# Patient Record
Sex: Female | Born: 1970 | Race: Black or African American | Hispanic: No | Smoking: Former smoker
Health system: Southern US, Community
[De-identification: ages and names within clinical notes are randomized; demographics above are authoritative.]

## PROBLEM LIST (undated history)

## (undated) DIAGNOSIS — J45909 Unspecified asthma, uncomplicated: Secondary | ICD-10-CM

## (undated) DIAGNOSIS — A6000 Herpesviral infection of urogenital system, unspecified: Secondary | ICD-10-CM

## (undated) DIAGNOSIS — G35 Multiple sclerosis: Principal | ICD-10-CM

## (undated) DIAGNOSIS — E785 Hyperlipidemia, unspecified: Secondary | ICD-10-CM

## (undated) HISTORY — DX: Unspecified asthma, uncomplicated: J45.909

## (undated) HISTORY — PX: BREAST FIBROADENOMA SURGERY: SHX580

## (undated) HISTORY — DX: Multiple sclerosis: G35

## (undated) HISTORY — PX: ABDOMINAL HYSTERECTOMY: SHX81

## (undated) HISTORY — DX: Hyperlipidemia, unspecified: E78.5

## (undated) HISTORY — DX: Herpesviral infection of urogenital system, unspecified: A60.00

---

## 1999-01-14 ENCOUNTER — Emergency Department (HOSPITAL_COMMUNITY): Admission: EM | Admit: 1999-01-14 | Discharge: 1999-01-14 | Payer: Self-pay | Admitting: *Deleted

## 2001-12-19 ENCOUNTER — Other Ambulatory Visit: Admission: RE | Admit: 2001-12-19 | Discharge: 2001-12-19 | Payer: Self-pay | Admitting: Obstetrics and Gynecology

## 2002-12-24 ENCOUNTER — Other Ambulatory Visit: Admission: RE | Admit: 2002-12-24 | Discharge: 2002-12-24 | Payer: Self-pay | Admitting: Obstetrics and Gynecology

## 2003-02-28 ENCOUNTER — Other Ambulatory Visit: Admission: RE | Admit: 2003-02-28 | Discharge: 2003-02-28 | Payer: Self-pay | Admitting: Obstetrics and Gynecology

## 2004-01-23 ENCOUNTER — Other Ambulatory Visit: Admission: RE | Admit: 2004-01-23 | Discharge: 2004-01-23 | Payer: Self-pay | Admitting: Obstetrics and Gynecology

## 2005-02-21 ENCOUNTER — Other Ambulatory Visit: Admission: RE | Admit: 2005-02-21 | Discharge: 2005-02-21 | Payer: Self-pay | Admitting: Obstetrics and Gynecology

## 2005-07-27 ENCOUNTER — Encounter: Admission: RE | Admit: 2005-07-27 | Discharge: 2005-07-27 | Payer: Self-pay | Admitting: Obstetrics and Gynecology

## 2006-04-03 ENCOUNTER — Other Ambulatory Visit: Admission: RE | Admit: 2006-04-03 | Discharge: 2006-04-03 | Payer: Self-pay | Admitting: Obstetrics & Gynecology

## 2006-04-03 ENCOUNTER — Other Ambulatory Visit: Admission: RE | Admit: 2006-04-03 | Discharge: 2006-04-03 | Payer: Self-pay | Admitting: Obstetrics and Gynecology

## 2006-05-02 ENCOUNTER — Other Ambulatory Visit: Admission: RE | Admit: 2006-05-02 | Discharge: 2006-05-02 | Payer: Self-pay | Admitting: Obstetrics and Gynecology

## 2012-10-04 ENCOUNTER — Other Ambulatory Visit: Payer: Self-pay

## 2012-10-04 MED ORDER — INTERFERON BETA-1A 44 MCG/0.5ML ~~LOC~~ SOLN: 44.0000 ug | SUBCUTANEOUS | Status: DC

## 2012-10-04 NOTE — Telephone Encounter (Signed)
Patient has appt scheduled in January

## 2013-03-17 ENCOUNTER — Other Ambulatory Visit: Payer: Self-pay

## 2013-03-17 MED ORDER — INTERFERON BETA-1A 44 MCG/0.5ML ~~LOC~~ SOLN: 44.0000 ug | SUBCUTANEOUS | Status: DC

## 2013-04-22 ENCOUNTER — Encounter (INDEPENDENT_AMBULATORY_CARE_PROVIDER_SITE_OTHER): Payer: Self-pay

## 2013-04-22 ENCOUNTER — Encounter: Payer: Self-pay | Admitting: Neurology

## 2013-04-22 ENCOUNTER — Ambulatory Visit (INDEPENDENT_AMBULATORY_CARE_PROVIDER_SITE_OTHER): Payer: BC Managed Care – PPO | Admitting: Neurology

## 2013-04-22 VITALS — BP 120/78 | HR 77 | Ht 63.75 in | Wt 160.0 lb

## 2013-04-22 DIAGNOSIS — G35 Multiple sclerosis: Secondary | ICD-10-CM

## 2013-04-22 HISTORY — DX: Multiple sclerosis: G35

## 2013-04-22 MED ORDER — INTERFERON BETA-1A 44 MCG/0.5ML ~~LOC~~ SOLN: 44.0000 ug | SUBCUTANEOUS | Status: DC

## 2013-04-22 NOTE — Progress Notes (Signed)
Reason for visit: Multiple sclerosis  Sheri Dickerson is an 43 y.o. female  History of present illness:  Sheri Dickerson is a 43 year old right-handed black female with a history of multiple sclerosis. The patient has been on Rebif, last seen through this office in December of 2012. The patient has been tolerating the Rebif quite well, occasionally she will have some red areas of the skin following injection. The patient recently had several sebaceous cysts removed from the left axillary area, one of which had MRSA. The patient denies any other new medical issues that have come up since last seen. The patient indicates that she has not had any new symptoms of numbness, weakness, balance problems, or bowel or bladder control issues. The patient has developed some blurred vision problems, currently requiring reading glasses. The patient does have some minor problems with urinary incontinence, and she wears adult diapers during the day. The patient returns to this office for further evaluation. The patient indicates that she recently had blood work done through her gynecologist, and a CBC and comprehensive metabolic profile was done. The patient does not have the results of this evaluation. The patient returns to this office for further evaluation.  Past Medical History  Diagnosis Date  . Multiple sclerosis 04/22/2013  . Genital herpes   . Dyslipidemia   . Asthma     Past Surgical History  Procedure Laterality Date  . Breast fibroadenoma surgery    . Abdominal hysterectomy      Family History  Problem Relation Age of Onset  . Hypertension Mother   . Asthma Maternal Grandmother   . Cancer Maternal Grandfather     throat cancer    Social history:  reports that she has quit smoking. She does not have any smokeless tobacco history on file. She reports that she drinks alcohol. She reports that she does not use illicit drugs.    Allergies  Allergen Reactions  . Codeine     Medications:    No current outpatient prescriptions on file prior to visit.   No current facility-administered medications on file prior to visit.    ROS:  Out of a complete 14 system review of symptoms, the patient complains only of the following symptoms, and all other reviewed systems are negative.  Fatigue Neck stiffness Insomnia  Blood pressure 120/78, pulse 77, height 5' 3.75" (1.619 m), weight 160 lb (72.576 kg).  Physical Exam  General: The patient is alert and cooperative at the time of the examination.  Skin: No significant peripheral edema is noted.   Neurologic Exam  Mental status: The patient is oriented x 3.  Cranial nerves: Facial symmetry is present. Speech is normal, no aphasia or dysarthria is noted. Extraocular movements are full. Visual fields are full. Pupils are equal, round, and reactive to light. Discs are flat bilaterally.  Motor: The patient has good strength in all 4 extremities.  Sensory examination: Soft touch and pinprick sensation were symmetric on the face, arms, and legs.  Coordination: The patient has good finger-nose-finger and heel-to-shin bilaterally.  Gait and station: The patient has a normal gait. Tandem gait is normal. Romberg is negative. No drift is seen.  Reflexes: Deep tendon reflexes are symmetric.   Assessment/Plan:  One. Multiple sclerosis  The patient is doing quite well at this time on Rebif. The patient will continue the medication for now. She will try to get the blood work results faxed to our office. The patient will followup in one year. A  prescription was given for the Rebif.  Marlan Palau. Keith Shyla Gayheart MD 04/22/2013 9:20 AM  [facility] [address] [place], [place] [id number]  Phone [phone] Fax [phone]

## 2013-04-22 NOTE — Patient Instructions (Signed)
Multiple Sclerosis  Multiple sclerosis (MS) is a disease of the central nervous system. It leads to loss of the insulating covering of the nerves (myelin sheath) of your brain. When this happens, brain signals do not get transmitted properly or may not get transmitted at all. The symptoms of MS occur in episodes or attacks. These attacks may last weeks to months. There may be long periods of nearly no problems between attacks. The age of onset of MS varies.   CAUSES  The cause of MS is unknown. However, it is more common in the [place] than in the southern [place].  RISK FACTORS  There is a higher incidence of MS in women than in men. MS is not an inherited illness, although your risk of MS is higher if you have a relative with MS.  SIGNS AND SYMPTOMS   The symptoms of MS occur in episodes or attacks. These attacks may last weeks to months. There may be long periods of almost no symptoms between attacks.  The symptoms of MS vary. This is because of the many different ways it affects the central nervous system. The main symptoms of MS include:   Vision problems and eye pain.   Numbness.   Weakness.   Paralysis in your arms, hands, feet, and legs (extremities).   Balance problems.   Tremors.  DIAGNOSIS   Your health care provider can diagnose MS with the help of imaging exams and lab tests. These may include specialized X-ray exams and spinal fluid tests. The best imaging exam to confirm a diagnosis of MS is MRI.  TREATMENT   There is no known cure for MS, but there are medicines that can decrease the number and frequency of attacks. Steroids are often used for short-term relief. Physical and occupational therapy may also help.  HOME CARE INSTRUCTIONS    Take medicines as directed by your health care provider.   Exercise as directed by your health care provider.  SEEK MEDICAL CARE IF:  You begin to feel depressed.  SEEK IMMEDIATE MEDICAL CARE IF:   You develop paralysis.   You develop  problems with bladder, bowel, or sexual function.   You develop mental changes, such as forgetfulness or mood swings.   You have a seizure.  Document Released: 03/11/2000 Document Revised: 01/02/2013 Document Reviewed: 11/19/2012  Exit[employer] Patient Information 2014 [employer].

## 2014-02-12 ENCOUNTER — Encounter: Payer: Self-pay | Admitting: Neurology

## 2014-04-21 ENCOUNTER — Encounter: Payer: Self-pay | Admitting: Adult Health

## 2014-04-21 ENCOUNTER — Ambulatory Visit: Payer: Self-pay | Admitting: Nurse Practitioner

## 2014-04-21 ENCOUNTER — Ambulatory Visit (INDEPENDENT_AMBULATORY_CARE_PROVIDER_SITE_OTHER): Payer: BC Managed Care – PPO | Admitting: Adult Health

## 2014-04-21 VITALS — BP 141/92 | HR 80 | Ht 63.0 in | Wt 165.0 lb

## 2014-04-21 DIAGNOSIS — G35 Multiple sclerosis: Secondary | ICD-10-CM

## 2014-04-21 DIAGNOSIS — Z5181 Encounter for therapeutic drug level monitoring: Secondary | ICD-10-CM

## 2014-04-21 DIAGNOSIS — R350 Frequency of micturition: Secondary | ICD-10-CM

## 2014-04-21 NOTE — Patient Instructions (Addendum)
Continue Rebif.  Recheck MRI brain.  We will check blood work today as well as a urinalysis.  If your symptoms worsen or you develop new symptoms please let us know.

## 2014-04-21 NOTE — Progress Notes (Signed)
I have read the note, and I agree with the clinical assessment and plan.  Nautia Lem KEITH   

## 2014-04-21 NOTE — Progress Notes (Signed)
PATIENT: Sheri Dickerson DOB: 09-05-1970  REASON FOR VISIT: follow up- multiple sclerosis HISTORY FROM: patient  HISTORY OF PRESENT ILLNESS: Sheri Dickerson is a 44 year old female with a hstiory of multiple sclerosis. She returns today for follow-up. She is currently taking Rebif and tolerating it well. She denies any new weakness or numbness. She states that in the morning when she wakes up  she has trouble opening up bottles. She states that her hands feel numb and weak. They will occasionally lock up in one position. As the day progresses this improves. She states this has been going on for several months. She will also have tingling in the feet either when sitting for a while or standing and again this has been going on for several months. The tingling is not constant.  No changes in her gait or balance. She ambulates without an assistive device. She states that she does have constipation and will use magnesium sulfate. She also has urinary frequency. She has been wearing the pose depends in case she has accidents. She states that she often has to hold her urine while at work. the bathroom at work is located in the other building where she works. She states that she often feels the need to go urinate every 15-30 minutes however she tends to hold it till its a good time a work.  No changes in her vision. Her last MRI of the brain according to the patient was >1-2 years ago.  The patient does state that she was on prednisone that she finished last week due to a rash on her head. While taking the prednisone she did not notice any benefit in her hands or feet.  HISTORY 04/22/13 (WILLIS): Sheri Dickerson is a 44 year old right-handed black female with a history of multiple sclerosis. The patient has been on Rebif, last seen through this office in December of 2012. The patient has been tolerating the Rebif quite well, occasionally she will have some red areas of the skin following injection. The patient recently  had several sebaceous cysts removed from the left axillary area, one of which had MRSA. The patient denies any other new medical issues that have come up since last seen. The patient indicates that she has not had any new symptoms of numbness, weakness, balance problems, or bowel or bladder control issues. The patient has developed some blurred vision problems, currently requiring reading glasses. The patient does have some minor problems with urinary incontinence, and she wears adult diapers during the day. The patient returns to this office for further evaluation. The patient indicates that she recently had blood work done through her gynecologist, and a CBC and comprehensive metabolic profile was done. The patient does not have the results of this evaluation. The patient returns to this office for further evaluation.     REVIEW OF SYSTEMS: Out of a complete 14 system review of symptoms, the patient complains only of the following symptoms, and all other reviewed systems are negative.   chills, runny nose, eye itching comment, blurred vision, cough, constipation, insomnia, frequent infections , frequency of urination, urgency, joint pain, walking difficulty , moles, itching, numbness, weakness  ALLERGIES: Allergies  Allergen Reactions  . Codeine     HOME MEDICATIONS: Outpatient Prescriptions Prior to Visit  Medication Sig Dispense Refill  . acetaminophen (TYLENOL) 325 MG tablet Take 650 mg by mouth as needed.    . Calcium-Phosphorus-Vitamin D (CALCIUM GUMMIES PO) Take by mouth.    . cetirizine (ZYRTEC)  10 MG tablet Take 10 mg by mouth daily.    . Cholecalciferol (VITAMIN D3 ADULT GUMMIES PO) Take by mouth.    . interferon beta-1a (REBIF) 44 MCG/0.5ML injection Inject 0.5 mLs (44 mcg total) into the skin 3 (three) times a week. 18 mL 3  . magnesium gluconate (MAGONATE) 500 MG tablet Take 500 mg by mouth 2 (two) times daily.    . Multiple Vitamins-Minerals (MULTIVITAMIN GUMMIES ADULT PO) Take  by mouth.    . Probiotic Product (PROBIOTIC DAILY PO) Take by mouth.    . valACYclovir (VALTREX) 500 MG tablet Take 500 mg by mouth daily.     . mometasone-formoterol (DULERA) 100-5 MCG/ACT AERO Inhale 2 puffs into the lungs 2 (two) times daily as needed for wheezing.     No facility-administered medications prior to visit.    PAST MEDICAL HISTORY: Past Medical History  Diagnosis Date  . Multiple sclerosis 04/22/2013  . Genital herpes   . Dyslipidemia   . Asthma     PAST SURGICAL HISTORY: Past Surgical History  Procedure Laterality Date  . Breast fibroadenoma surgery    . Abdominal hysterectomy      FAMILY HISTORY: Family History  Problem Relation Age of Onset  . Hypertension Mother   . Asthma Maternal Grandmother   . Cancer Maternal Grandfather     throat cancer    SOCIAL HISTORY: History   Social History  . Marital Status: Single    Spouse Name: N/A    Number of Children: 0  . Years of Education: college   Occupational History  . [employer]    Social History Main Topics  . Smoking status: Former Games developer  . Smokeless tobacco: Not on file  . Alcohol Use: Yes     Comment: occasional  . Drug Use: No  . Sexual Activity: Not on file   Other Topics Concern  . Not on file   Social History Narrative      PHYSICAL EXAM  Filed Vitals:   04/21/14 1532  BP: 141/92  Pulse: 80  Height: 5\' 3"  (1.6 m)  Weight: 165 lb (74.844 kg)   Body mass index is 29.24 kg/(m^2).  Generalized: Well developed, in no acute distress   Neurological examination  Mentation: Alert oriented to time, place, history taking. Follows all commands speech and language fluent Cranial nerve II-XII: Pupils were equal round reactive to light. Extraocular movements were full, visual field were full on confrontational test. Facial sensation and strength were normal. Uvula tongue midline. Head turning and shoulder shrug  were normal and symmetric. Motor: The motor testing reveals 5  over 5 strength of all 4 extremities. Good symmetric motor tone is noted throughout.  Tinel sign negative Sensory: Sensory testing is intact to soft touch on all 4 extremities. No evidence of extinction is noted.  Coordination: Cerebellar testing reveals good finger-nose-finger and heel-to-shin bilaterally.  Gait and station: Gait is normal. Tandem gait is normal. Romberg is negative. No drift is seen.  Reflexes: Deep tendon reflexes are symmetric and normal bilaterally.  Marland Kitchen   DIAGNOSTIC DATA (LABS, IMAGING, TESTING) - I reviewed patient records, labs, notes, testing and imaging myself where available.   MRI of the brain W/WO contrast 03/30/2011:  impression:  Multiple supratentorial and one infratentorial chronic demyelinating plaques. No acute plaques are seen.    ASSESSMENT AND PLAN 44 y.o. year old female  has a past medical history of Multiple sclerosis (04/22/2013); Genital herpes; Dyslipidemia; and Asthma. here with:  1. Multiple sclerosis  2. Urinary frequency   The patient is reporting some numbness and weakness in the hands noted primarily in the mornings as well as tingling in the feet that occurs intermittently with standing or sitting.  Tinel sign was negative. The patient's last MRI was in 2013. I will recheck an MRI of the brain to look for progression. The patient is also reporting urinary frequency. Saying that she feels the urge to go every 15-30 minutes. I will check a urinalysis to ensure that she does not have an infection. If not we may consider starting oxybutynin. The patient should continue taking rebif.  I will check blood work today. If her symptoms worsen or she develops new symptoms she should let us know. Otherwise she will follow up in 6 months or sooner if needed.    Butch Penny, MSN, NP-C 04/21/2014, 3:59 PM [facility] [address], [address] [place], [place] [address] [phone]  Note: This document was prepared with digital  dictation and possible smart phrase technology. Any transcriptional errors that result from this process are unintentional.

## 2014-04-22 ENCOUNTER — Telehealth: Payer: Self-pay | Admitting: Adult Health

## 2014-04-22 ENCOUNTER — Ambulatory Visit: Payer: Self-pay | Admitting: Adult Health

## 2014-04-22 LAB — COMPREHENSIVE METABOLIC PANEL
ALBUMIN: 4.4 g/dL (ref 3.5–5.5)
ALK PHOS: 98 IU/L (ref 39–117)
ALT: 20 IU/L (ref 0–32)
AST: 20 IU/L (ref 0–40)
Albumin/Globulin Ratio: 1.5 (ref 1.1–2.5)
BUN/Creatinine Ratio: 18 (ref 9–23)
BUN: 14 mg/dL (ref 6–24)
CO2: 27 mmol/L (ref 18–29)
Calcium: 10.2 mg/dL (ref 8.7–10.2)
Chloride: 102 mmol/L (ref 97–108)
Creatinine, Ser: 0.78 mg/dL (ref 0.57–1.00)
GFR calc Af Amer: 107 mL/min/{1.73_m2} (ref >59)
GFR calc non Af Amer: 93 mL/min/{1.73_m2} (ref >59)
GLUCOSE: 104 mg/dL — AB (ref 65–99)
Globulin, Total: 2.9 g/dL (ref 1.5–4.5)
Potassium: 5.4 mmol/L — ABNORMAL HIGH (ref 3.5–5.2)
Sodium: 144 mmol/L (ref 134–144)
TOTAL PROTEIN: 7.3 g/dL (ref 6.0–8.5)

## 2014-04-22 LAB — URINALYSIS
Bilirubin, UA: NEGATIVE
Glucose, UA: NEGATIVE
KETONES UA: NEGATIVE
Leukocytes, UA: NEGATIVE
NITRITE UA: NEGATIVE
PH UA: 5.5 (ref 5.0–7.5)
PROTEIN UA: NEGATIVE
RBC UA: NEGATIVE
Specific Gravity, UA: 1.027 (ref 1.005–1.030)
Urobilinogen, Ur: 0.2 mg/dL (ref 0.2–1.0)

## 2014-04-22 LAB — CBC WITH DIFFERENTIAL/PLATELET
Basophils Absolute: 0 10*3/uL (ref 0.0–0.2)
Basos: 0 %
EOS ABS: 0.2 10*3/uL (ref 0.0–0.4)
Eos: 2 %
HEMATOCRIT: 37.8 % (ref 34.0–46.6)
HEMOGLOBIN: 12.8 g/dL (ref 11.1–15.9)
IMMATURE GRANS (ABS): 0 10*3/uL (ref 0.0–0.1)
IMMATURE GRANULOCYTES: 0 %
Lymphocytes Absolute: 3.6 10*3/uL — ABNORMAL HIGH (ref 0.7–3.1)
Lymphs: 40 %
MCH: 30.7 pg (ref 26.6–33.0)
MCHC: 33.9 g/dL (ref 31.5–35.7)
MCV: 91 fL (ref 79–97)
MONOS ABS: 0.6 10*3/uL (ref 0.1–0.9)
Monocytes: 7 %
Neutrophils Absolute: 4.6 10*3/uL (ref 1.4–7.0)
Neutrophils Relative %: 51 %
PLATELETS: 295 10*3/uL (ref 150–379)
RBC: 4.17 x10E6/uL (ref 3.77–5.28)
RDW: 14.2 % (ref 12.3–15.4)
WBC: 9 10*3/uL (ref 3.4–10.8)

## 2014-04-22 MED ORDER — OXYBUTYNIN CHLORIDE ER 5 MG PO TB24: 5.0000 mg | ORAL_TABLET | Freq: Every day | ORAL | Status: DC

## 2014-04-22 NOTE — Telephone Encounter (Signed)
I called the patient. Her lab work was unremarkable. Her urinalysis did not indicate any type of infection. I will start the patient on Ditropan XL 5 mg daily at bedtime. I have reviewed the side effects with the patient. She verbalized understanding. If she is unable to tolerate this medication she should let us know.

## 2014-05-21 ENCOUNTER — Other Ambulatory Visit: Payer: Self-pay | Admitting: Adult Health

## 2014-05-22 ENCOUNTER — Ambulatory Visit (INDEPENDENT_AMBULATORY_CARE_PROVIDER_SITE_OTHER): Payer: BC Managed Care – PPO

## 2014-05-22 DIAGNOSIS — G35 Multiple sclerosis: Secondary | ICD-10-CM | POA: Diagnosis not present

## 2014-05-22 MED ORDER — GADOPENTETATE DIMEGLUMINE 469.01 MG/ML IV SOLN: 15.0000 mL | Freq: Once | INTRAVENOUS | Status: AC | PRN

## 2014-05-26 ENCOUNTER — Telehealth: Payer: Self-pay | Admitting: Adult Health

## 2014-05-26 NOTE — Telephone Encounter (Signed)
I called the patient. We have the patient's MRI of the brain results however it has not been compared to her previous scan. I have requested the CD from her previous scan. Once I have this I will ask Dr. Pearlean Brownie to compare the two scans. Patient was called and made aware.

## 2014-05-29 NOTE — Progress Notes (Signed)
Quick Note:  Called and spoke with patient and relayed MRI was compared no change Current MRI stable. Patient understood. ______

## 2014-07-14 ENCOUNTER — Telehealth: Payer: Self-pay | Admitting: Neurology

## 2014-07-14 MED ORDER — INTERFERON BETA-1A 44 MCG/0.5ML ~~LOC~~ SOSY: 44.0000 ug | PREFILLED_SYRINGE | SUBCUTANEOUS | Status: DC

## 2014-07-14 NOTE — Telephone Encounter (Signed)
Patient requesting 90 day supply refill for Rx interferon beta-1a (REBIF) 44 MCG/0.5ML injection.  Please forward to [facility].  Please call and advise.

## 2014-07-14 NOTE — Telephone Encounter (Signed)
Rx has been sent.  I called back to advise x 3.  Each time it sounded like someone picked up, but never said anything and call disconnected.

## 2014-10-31 ENCOUNTER — Ambulatory Visit: Payer: BC Managed Care – PPO | Admitting: Adult Health

## 2015-02-03 ENCOUNTER — Other Ambulatory Visit: Payer: Self-pay

## 2015-02-03 MED ORDER — INTERFERON BETA-1A 44 MCG/0.5ML ~~LOC~~ SOSY: 44.0000 ug | PREFILLED_SYRINGE | SUBCUTANEOUS | Status: DC

## 2015-06-08 ENCOUNTER — Telehealth: Payer: Self-pay | Admitting: Neurology

## 2015-06-08 MED ORDER — INTERFERON BETA-1A 44 MCG/0.5ML ~~LOC~~ SOSY: 44.0000 ug | PREFILLED_SYRINGE | SUBCUTANEOUS | Status: DC

## 2015-06-08 NOTE — Telephone Encounter (Signed)
Pt hasn't been seen in over a year. I called and spoke with pt, got her scheduled for 07/23/15 at 8:00. Pt verbalized understanding. Refilled rebif and sent to [facility].

## 2015-06-08 NOTE — Telephone Encounter (Signed)
Pt called sts she is out of interferon beta-1a (REBIF) 44 MCG/0.5ML SOSY injection 3 mth supply. She has a new insurance card, Ins is still with [facility] plan ID# [id number]  BIN# [id number]  IEP#[id number].

## 2015-06-09 ENCOUNTER — Telehealth: Payer: Self-pay | Admitting: Neurology

## 2015-06-09 NOTE — Telephone Encounter (Signed)
[facility] called and needs to verbally verify the rx that was sent to them from [facility]. Pt has had an insurance change and will be using [facility] now. Please call [phone] ask for a pharmacist

## 2015-06-09 NOTE — Telephone Encounter (Signed)
Marissa with [facility] [phone] ext [phone] called and requesting diagnosis code for pt. I gave her the new Insurance info

## 2015-06-09 NOTE — Telephone Encounter (Signed)
Please see if you can verify Rx as requested.

## 2015-06-09 NOTE — Telephone Encounter (Signed)
Have attempted to reach Sheri Dickerson with [facility] x 3; no option to enter ext #. After automated system states "call will be transferred", the  call is disconnected.

## 2015-06-10 NOTE — Telephone Encounter (Signed)
Spoke with Ladona Ridgel, pharmacist, [facility]. She requested verification of patient's rebif prescription. Clarified all details, directions, instructions of prescription. Ladona Ridgel verbalized understanding, appreciation for call back.

## 2015-07-01 ENCOUNTER — Telehealth: Payer: Self-pay | Admitting: Adult Health

## 2015-07-01 NOTE — Telephone Encounter (Signed)
We have never received CD of her previous MRI brain. Can you get these?

## 2015-07-23 ENCOUNTER — Ambulatory Visit (INDEPENDENT_AMBULATORY_CARE_PROVIDER_SITE_OTHER): Payer: BC Managed Care – PPO | Admitting: Adult Health

## 2015-07-23 ENCOUNTER — Ambulatory Visit: Payer: Self-pay | Admitting: Neurology

## 2015-07-23 ENCOUNTER — Encounter: Payer: Self-pay | Admitting: Adult Health

## 2015-07-23 VITALS — BP 150/95 | HR 81 | Ht 63.0 in | Wt 150.2 lb

## 2015-07-23 DIAGNOSIS — G35 Multiple sclerosis: Secondary | ICD-10-CM

## 2015-07-23 MED ORDER — OXYBUTYNIN CHLORIDE ER 5 MG PO TB24: 5.0000 mg | ORAL_TABLET | Freq: Every day | ORAL | Status: DC

## 2015-07-23 NOTE — Progress Notes (Signed)
PATIENT: Sheri Dickerson DOB: 1971-01-08  REASON FOR VISIT: follow up- multiple sclerosis HISTORY FROM: patient  HISTORY OF PRESENT ILLNESS: Sheri Dickerson is a 45 year old female with a history of multiple sclerosis. She returns today for follow-up. She is currently taking Rebif and tolerating it well. She does state that the injection sites are sometimes red and sore. She has been trying to use a warm compress prior to the injections. She states that at times she does have blurry vision. She also feels that  her balance is off at times. Denies any falls. She does report cramps in the legs. She reports that she is drinking water throughout the day. She has continued having trouble opening bottles and jars. She is using oxybutynin for urinary urgency.  She denies any new numbness or weakness. Her last MRI was February 2016. She reports that she recently had blood work through her OB/GYN. She returns today for evaluation.  HISTORY 04/21/14 (MM): Sheri Dickerson is a 45 year old female with a hstiory of multiple sclerosis. She returns today for follow-up. She is currently taking Rebif and tolerating it well. She denies any new weakness or numbness. She states that in the morning when she wakes up she has trouble opening up bottles. She states that her hands feel numb and weak. They will occasionally lock up in one position. As the day progresses this improves. She states this has been going on for several months. She will also have tingling in the feet either when sitting for a while or standing and again this has been going on for several months. The tingling is not constant. No changes in her gait or balance. She ambulates without an assistive device. She states that she does have constipation and will use magnesium sulfate. She also has urinary frequency. She has been wearing the pose depends in case she has accidents. She states that she often has to hold her urine while at work. the bathroom at work is  located in the other building where she works. She states that she often feels the need to go urinate every 15-30 minutes however she tends to hold it till its a good time a work. No changes in her vision. Her last MRI of the brain according to the patient was >1-2 years ago. The patient does state that she was on prednisone that she finished last week due to a rash on her head. While taking the prednisone she did not notice any benefit in her hands or feet.  HISTORY 04/22/13 (WILLIS): Sheri Dickerson is a 45 year old right-handed black female with a history of multiple sclerosis. The patient has been on Rebif, last seen through this office in December of 2012. The patient has been tolerating the Rebif quite well, occasionally she will have some red areas of the skin following injection. The patient recently had several sebaceous cysts removed from the left axillary area, one of which had MRSA. The patient denies any other new medical issues that have come up since last seen. The patient indicates that she has not had any new symptoms of numbness, weakness, balance problems, or bowel or bladder control issues. The patient has developed some blurred vision problems, currently requiring reading glasses. The patient does have some minor problems with urinary incontinence, and she wears adult diapers during the day. The patient returns to this office for further evaluation. The patient indicates that she recently had blood work done through her gynecologist, and a CBC and comprehensive metabolic profile  was done. The patient does not have the results of this evaluation. The patient returns to this office for further evaluation  REVIEW OF SYSTEMS: Out of a complete 14 system review of symptoms, the patient complains only of the following symptoms, and all other reviewed systems are negative.  ALLERGIES: Allergies  Allergen Reactions  . Codeine     HOME MEDICATIONS: Outpatient Prescriptions Prior to Visit    Medication Sig Dispense Refill  . acetaminophen (TYLENOL) 325 MG tablet Take 650 mg by mouth as needed.    . Calcium-Phosphorus-Vitamin D (CALCIUM GUMMIES PO) Take by mouth.    . cetirizine (ZYRTEC) 10 MG tablet Take 10 mg by mouth daily.    . Cholecalciferol (VITAMIN D3 ADULT GUMMIES PO) Take by mouth.    . interferon beta-1a (REBIF) 44 MCG/0.5ML SOSY injection Inject 0.5 mLs (44 mcg total) into the skin 3 (three) times a week. 36 Syringe 0  . magnesium gluconate (MAGONATE) 500 MG tablet Take 500 mg by mouth 2 (two) times daily.    . mometasone-formoterol (DULERA) 100-5 MCG/ACT AERO Inhale 2 puffs into the lungs 2 (two) times daily as needed for wheezing.    . Multiple Vitamins-Minerals (MULTIVITAMIN GUMMIES ADULT PO) Take by mouth.    . oxybutynin (DITROPAN-XL) 5 MG 24 hr tablet TAKE 1 TABLET (5 MG TOTAL) BY MOUTH AT BEDTIME. 30 tablet 0  . Probiotic Product (PROBIOTIC DAILY PO) Take by mouth.    . valACYclovir (VALTREX) 500 MG tablet Take 500 mg by mouth daily.      No facility-administered medications prior to visit.    PAST MEDICAL HISTORY: Past Medical History  Diagnosis Date  . Multiple sclerosis (HCC) 04/22/2013  . Genital herpes   . Dyslipidemia   . Asthma     PAST SURGICAL HISTORY: Past Surgical History  Procedure Laterality Date  . Breast fibroadenoma surgery    . Abdominal hysterectomy      FAMILY HISTORY: Family History  Problem Relation Age of Onset  . Hypertension Mother   . Asthma Maternal Grandmother   . Cancer Maternal Grandfather     throat cancer    SOCIAL HISTORY: Social History   Social History  . Marital Status: Single    Spouse Name: N/A  . Number of Children: 0  . Years of Education: college   Occupational History  . [employer]    Social History Main Topics  . Smoking status: Former Games developer  . Smokeless tobacco: Not on file  . Alcohol Use: Yes     Comment: occasional  . Drug Use: No  . Sexual Activity: Not on file   Other  Topics Concern  . Not on file   Social History Narrative      PHYSICAL EXAM  Filed Vitals:   07/23/15 0852  BP: 150/95  Pulse: 81  Height: 5\' 3"  (1.6 m)  Weight: 150 lb 3.2 oz (68.13 kg)   Body mass index is 26.61 kg/(m^2).  Generalized: Well developed, in no acute distress   Neurological examination  Mentation: Alert oriented to time, place, history taking. Follows all commands speech and language fluent Cranial nerve II-XII: Pupils were equal round reactive to light. Extraocular movements were full, visual field were full on confrontational test. Facial sensation and strength were normal. Uvula tongue midline. Head turning and shoulder shrug  were normal and symmetric. Motor: The motor testing reveals 5 over 5 strength of all 4 extremities. Good symmetric motor tone is noted throughout.  Sensory: Sensory testing is  intact to soft touch on all 4 extremities. No evidence of extinction is noted.  Coordination: Cerebellar testing reveals good finger-nose-finger and heel-to-shin bilaterally.  Gait and station: Gait is normal. Tandem gait is normal. Romberg is negative. No drift is seen.  Reflexes: Deep tendon reflexes are symmetric and normal bilaterally.   DIAGNOSTIC DATA (LABS, IMAGING, TESTING) - I reviewed patient records, labs, notes, testing and imaging myself where available.  Lab Results  Component Value Date   WBC 9.0 04/21/2014   HGB 12.8 04/21/2014   HCT 37.8 04/21/2014   MCV 91 04/21/2014   PLT 295 04/21/2014      Component Value Date/Time   NA 144 04/21/2014 1632   K 5.4* 04/21/2014 1632   CL 102 04/21/2014 1632   CO2 27 04/21/2014 1632   GLUCOSE 104* 04/21/2014 1632   BUN 14 04/21/2014 1632   CREATININE 0.78 04/21/2014 1632   CALCIUM 10.2 04/21/2014 1632   PROT 7.3 04/21/2014 1632   ALBUMIN 4.4 04/21/2014 1632   AST 20 04/21/2014 1632   ALT 20 04/21/2014 1632   ALKPHOS 98 04/21/2014 1632   BILITOT <0.2 04/21/2014 1632   GFRNONAA 93 04/21/2014 1632     GFRAA 107 04/21/2014 1632      ASSESSMENT AND PLAN 45 y.o. year old female  has a past medical history of Multiple sclerosis (HCC) (04/22/2013); Genital herpes; Dyslipidemia; and Asthma. here with:  1. Multiple sclerosis  The patient's physical exam is unremarkable. Overall she is doing well. She will continue on Rebif. She recently had blood work with her OB/GYN. She will have the results faxed to our office. The patient had an MRI in February 2016 however it was not compared to her previous scan. I now have the CD and will have Dr. Pearlean Brownie make a comparison. I will call the patient with the results. She is amenable to this plan. She will follow-up in 6 months with Dr. Anne Hahn.     Butch Penny, MSN, NP-C 07/23/2015, 9:08 AM [facility] [address] [place], [place] [address] [phone]

## 2015-07-23 NOTE — Patient Instructions (Signed)
Continue Rebif  Continue oxybutynin  Have blood faxed to our office If your symptoms worsen or you develop new symptoms please let us know.

## 2015-07-23 NOTE — Progress Notes (Signed)
I agree with the assessment and plan as directed by NP .The patient was seen in my role as a WID   Kymere Fullington, MD  

## 2015-07-27 ENCOUNTER — Telehealth: Payer: Self-pay | Admitting: Adult Health

## 2015-07-27 NOTE — Telephone Encounter (Signed)
I called the patient and left a message. I asked that she call our office.   I had Dr. Pearlean Brownie compare her MRIs. He reports that there was some slight changes and possibly one new lesion. He recommended that she remain on rebif unless she develops new symptoms.

## 2015-07-30 ENCOUNTER — Telehealth: Payer: Self-pay | Admitting: Adult Health

## 2015-07-30 NOTE — Telephone Encounter (Signed)
I called the patient to review her MRI results. Left message for her to call our office at her convenience.  Also received the patient's blood work from her primary care. Her blood work was relatively unremarkable.  WBC 6.7 RBC 4.06 Hgb 12.5 HCT 37.8 AST 23 ALT 23

## 2015-08-27 ENCOUNTER — Other Ambulatory Visit: Payer: Self-pay | Admitting: Neurology

## 2015-09-18 ENCOUNTER — Telehealth: Payer: Self-pay | Admitting: Neurology

## 2015-09-18 MED ORDER — INTERFERON BETA-1A 44 MCG/0.5ML ~~LOC~~ SOSY: PREFILLED_SYRINGE | SUBCUTANEOUS | Status: DC

## 2015-09-18 NOTE — Telephone Encounter (Signed)
90 day refill e-scribed as requested

## 2015-09-18 NOTE — Telephone Encounter (Signed)
[facility] [phone] called to request change in REBIF 44 MCG/0.5ML SOSY injection prescription to a 90 day supply.

## 2016-01-22 ENCOUNTER — Encounter: Payer: Self-pay | Admitting: Neurology

## 2016-01-22 ENCOUNTER — Ambulatory Visit (INDEPENDENT_AMBULATORY_CARE_PROVIDER_SITE_OTHER): Payer: BC Managed Care – PPO | Admitting: Neurology

## 2016-01-22 VITALS — BP 141/94 | HR 94 | Ht 63.0 in | Wt 149.2 lb

## 2016-01-22 DIAGNOSIS — M533 Sacrococcygeal disorders, not elsewhere classified: Secondary | ICD-10-CM | POA: Insufficient documentation

## 2016-01-22 MED ORDER — METHOCARBAMOL 500 MG PO TABS: 500.0000 mg | ORAL_TABLET | Freq: Three times a day (TID) | ORAL | 0 refills | Status: DC

## 2016-01-22 MED ORDER — MELOXICAM 15 MG PO TABS: 15.0000 mg | ORAL_TABLET | Freq: Every day | ORAL | 1 refills | Status: DC

## 2016-01-22 MED ORDER — FLUOXETINE HCL 10 MG PO CAPS: 10.0000 mg | ORAL_CAPSULE | Freq: Every day | ORAL | 3 refills | Status: DC

## 2016-01-22 NOTE — Progress Notes (Signed)
Reason for visit: Multiple sclerosis  Sheri Dickerson is an 45 y.o. female  History of present illness:  Sheri Dickerson is a 45 year old right-handed black female with a history of multiple sclerosis. The patient is on Rebif, she is tolerating the medication well. She has not had any definite new symptoms associated with her multiple sclerosis. She has some difficulty with bladder control, she is on medication for this. She apparent had a fall 2 or 3 months ago when she fell out of a chair. The patient has had some discomfort at the base of the spine with sitting, she has no pain with standing or walking. The patient also has some stiffness of the neck, some discomfort into the left neck and into the right shoulder area. She has some crepitus with turning her head. She denies any pain going down arms. The pain has not completely improved over time. She returns to this office for an evaluation. She reports some anxiety with work, she wants to know if she can take a medication to help calm this down.  Past Medical History:  Diagnosis Date  . Asthma   . Dyslipidemia   . Genital herpes   . Multiple sclerosis (HCC) 04/22/2013    Past Surgical History:  Procedure Laterality Date  . ABDOMINAL HYSTERECTOMY    . BREAST FIBROADENOMA SURGERY      Family History  Problem Relation Age of Onset  . Hypertension Mother   . Asthma Maternal Grandmother   . Cancer Maternal Grandfather     throat cancer    Social history:  reports that she has quit smoking. She has never used smokeless tobacco. She reports that she drinks alcohol. She reports that she does not use drugs.    Allergies  Allergen Reactions  . Codeine     Medications:  Prior to Admission medications   Medication Sig Start Date End Date Taking? Authorizing Provider  acetaminophen (TYLENOL) 325 MG tablet Take 650 mg by mouth as needed.   Yes Historical Provider, MD  Calcium-Phosphorus-Vitamin D (CALCIUM GUMMIES PO) Take by mouth.    Yes Historical Provider, MD  cetirizine (ZYRTEC) 10 MG tablet Take 10 mg by mouth daily.   Yes Historical Provider, MD  Cholecalciferol (VITAMIN D3 ADULT GUMMIES PO) Take by mouth.   Yes Historical Provider, MD  fluticasone Aleda Grana(FLONASE) 50 MCG/ACT nasal spray  05/11/13  Yes Historical Provider, MD  interferon beta-1a (REBIF) 44 MCG/0.5ML SOSY injection INJECT 44MCG SUBCUTANEOUSLY THREE TIMES A WEEK 09/18/15  Yes York Spanielharles K Cheron Pasquarelli, MD  magnesium gluconate (MAGONATE) 500 MG tablet Take 500 mg by mouth 2 (two) times daily.   Yes Historical Provider, MD  mometasone-formoterol (DULERA) 100-5 MCG/ACT AERO Inhale 2 puffs into the lungs 2 (two) times daily as needed for wheezing.   Yes Historical Provider, MD  Multiple Vitamins-Minerals (MULTIVITAMIN GUMMIES ADULT PO) Take by mouth.   Yes Historical Provider, MD  oxybutynin (DITROPAN-XL) 5 MG 24 hr tablet Take 1 tablet (5 mg total) by mouth at bedtime. 07/23/15  Yes Butch PennyMegan Millikan, NP  Probiotic Product (PROBIOTIC DAILY PO) Take by mouth.   Yes Historical Provider, MD  valACYclovir (VALTREX) 500 MG tablet Take 500 mg by mouth daily.    Yes Historical Provider, MD    ROS:  Out of a complete 14 system review of symptoms, the patient complains only of the following symptoms, and all other reviewed systems are negative.  Chills, fatigue Eye redness Cold intolerance, excessive thirst Constipation Restless legs, frequent waking  Frequency of urination, urinary urgency Bruising easily Headache, numbness, weakness Anxiety, hyperactivity  Blood pressure (!) 141/94, pulse 94, height 5\' 3"  (1.6 m), weight 149 lb 4 oz (67.7 kg).  Physical Exam  General: The patient is alert and cooperative at the time of the examination.  Neuromuscular: Range of movement of the cervical spine is full.  Skin: No significant peripheral edema is noted.   Neurologic Exam  Mental status: The patient is alert and oriented x 3 at the time of the examination. The patient has  apparent normal recent and remote memory, with an apparently normal attention span and concentration ability.   Cranial nerves: Facial symmetry is present. Speech is normal, no aphasia or dysarthria is noted. Extraocular movements are full. Visual fields are full.  Motor: The patient has good strength in all 4 extremities.  Sensory examination: Soft touch sensation is symmetric on the face, arms, and legs.  Coordination: The patient has good finger-nose-finger and heel-to-shin bilaterally.  Gait and station: The patient has a normal gait. Tandem gait is normal. Romberg is negative. No drift is seen.  Reflexes: Deep tendon reflexes are symmetric.   MRI brain 05/22/14:  IMPRESSION:  Abnormal MRI scan of the brain showing multiple supratentorial periventricular, subcortical and corpus callosum white matter hyperintensities typical for demyelinating disease. No enhancing lesions are noted. Presence of multiple T1 black holes and mild atrophy suggest chronic disease  * MRI scan images were reviewed online. I agree with the written report.    Assessment/Plan:  1. Multiple sclerosis  2. Cervical strain  3. Coccygeal pain  The patient will be placed on Mobic and Robaxin for 6 weeks, if she does not improve with her symptoms, we may initiate physical therapy and consider x-rays of the neck. The patient will continue her Rebif, she will be placed on low-dose Prozac for her anxiety issues. She will follow-up in 6 months. She has had blood work done in the spring of 2017 through her OB/GYN doctor. MRI of the brain can be done at some point next year.  Marlan Palau MD 01/22/2016 9:23 AM  [facility] [address] [place], [place] [address]  Phone [phone] Fax [phone]

## 2016-02-15 ENCOUNTER — Other Ambulatory Visit: Payer: Self-pay

## 2016-02-15 MED ORDER — FLUOXETINE HCL 10 MG PO CAPS: 10.0000 mg | ORAL_CAPSULE | Freq: Every day | ORAL | 3 refills | Status: DC

## 2016-02-15 NOTE — Telephone Encounter (Signed)
90 day refills sent in to mail order pharmacy per faxed request. 

## 2016-06-06 ENCOUNTER — Telehealth: Payer: Self-pay | Admitting: *Deleted

## 2016-06-06 NOTE — Telephone Encounter (Signed)
Called and LVM for pt to call. Gave hours and advised we are closing early today at 12pm d/t weather  Received notification from [facility] that pt may not be filling rx fluoxetine. Wanted to verify this with pt.

## 2016-06-14 NOTE — Telephone Encounter (Signed)
Called and spoke with pt.  Relayed message below. Verified she was given fluoxetine for anxiety. She stated she is taking fluoxetine but not every day. Educated patient that this medication needs to be taken everyday, do not miss any doses. Takes at least 4-6 weeks for it to reach max benefit and for her to see if it is helping. She verbalized understanding and stated she was not aware of this. She will start to take everyday. She does not need refill. She has enough. She will call with any further questions or concerns.

## 2016-06-30 MED ORDER — ESCITALOPRAM OXALATE 10 MG PO TABS: 10.0000 mg | ORAL_TABLET | Freq: Every day | ORAL | 3 refills | Status: DC

## 2016-06-30 NOTE — Telephone Encounter (Signed)
I called patient. The patient indicates that she started taking Prozac daily and now has a skin rash with hives. She stopped the medication one week ago, but the rash is still there. It may take 3 weeks or Prozac to get out of the system. I will call and Lexapro, but the patient is not to start this medication until the skin rash is gone. If the skin rash does not abate, she will need to see dermatology.

## 2016-06-30 NOTE — Telephone Encounter (Signed)
Patient wants to discuss medication FLUoxetine (PROZAC) 10 MG capsule. She says when she started taking this medication every day she started breaking out in hives. Please call to discuss.

## 2016-06-30 NOTE — Addendum Note (Signed)
Addended by: York Spaniel on: 06/30/2016 10:35 AM   Modules accepted: Orders

## 2016-07-25 ENCOUNTER — Encounter (INDEPENDENT_AMBULATORY_CARE_PROVIDER_SITE_OTHER): Payer: Self-pay

## 2016-07-25 ENCOUNTER — Ambulatory Visit (INDEPENDENT_AMBULATORY_CARE_PROVIDER_SITE_OTHER): Payer: BC Managed Care – PPO | Admitting: Adult Health

## 2016-07-25 ENCOUNTER — Encounter: Payer: Self-pay | Admitting: Adult Health

## 2016-07-25 VITALS — BP 148/80 | HR 80 | Ht 63.0 in | Wt 148.6 lb

## 2016-07-25 DIAGNOSIS — M542 Cervicalgia: Secondary | ICD-10-CM | POA: Diagnosis not present

## 2016-07-25 DIAGNOSIS — G35 Multiple sclerosis: Secondary | ICD-10-CM | POA: Diagnosis not present

## 2016-07-25 DIAGNOSIS — Z5181 Encounter for therapeutic drug level monitoring: Secondary | ICD-10-CM

## 2016-07-25 DIAGNOSIS — F419 Anxiety disorder, unspecified: Secondary | ICD-10-CM | POA: Diagnosis not present

## 2016-07-25 NOTE — Progress Notes (Signed)
I have read the note, and I agree with the clinical assessment and plan.  Shannell Mikkelsen KEITH   

## 2016-07-25 NOTE — Patient Instructions (Signed)
Continue Rebif Blood work today  MRI brain and cervical spine If your symptoms worsen or you develop new symptoms please let us know.

## 2016-07-25 NOTE — Progress Notes (Signed)
PATIENT: Sheri Dickerson DOB: 08-12-1970  REASON FOR VISIT: follow up- multiple sclerosis HISTORY FROM: patient  HISTORY OF PRESENT ILLNESS: Sheri Dickerson is a 46 year old female with a history of multiple sclerosis. She returns today for follow-up. She remains on Rebif and is tolerating it well. She states overall her symptoms have remained stable. She does notice some cramping in the toes and calf muscles. She states that if she sits for a while she does feel stiff when standing. Denies any new numbness or weakness. She states that she had some mild changes with her balance. She reports occasionally she feels as if she is going to fall but denies any falls. She reports that she will occasionally stumble. She is now wearing glasses. Denies any changes with the bowels or bladder. She does still experience urinary urgency. Reports that she is not taking the oxybutynin consistently. She was placed on Prozac for anxiety however she felt that it caused hives so she discontinued this medication however this did not resolve. She reports that she has been told it may be "bug bites." She plans to follow-up with dermatology. She was given a prescription for Lexapro however she has not started this as she is waiting to see dermatology first. She reports that Robaxin and motivate did help with her neck pain. She does report that she still has some pain when turning the head to the left. Denies any pain or numbness or tingling  sensation in the arms She returns today for an evaluation.  HISTORY 01/22/16: Copied from Dr. Clarisa Kindred notes: Sheri Dickerson is a 46 year old right-handed black female with a history of multiple sclerosis. The patient is on Rebif, she is tolerating the medication well. She has not had any definite new symptoms associated with her multiple sclerosis. She has some difficulty with bladder control, she is on medication for this. She apparent had a fall 2 or 3 months ago when she fell out of a chair.  The patient has had some discomfort at the base of the spine with sitting, she has no pain with standing or walking. The patient also has some stiffness of the neck, some discomfort into the left neck and into the right shoulder area. She has some crepitus with turning her head. She denies any pain going down arms. The pain has not completely improved over time. She returns to this office for an evaluation. She reports some anxiety with work, she wants to know if she can take a medication to help calm this down.   REVIEW OF SYSTEMS: Out of a complete 14 system review of symptoms, the patient complains only of the following symptoms, and all other reviewed systems are negative.  Insomnia, rash, itching, nervous/anxious, hyperactive, urgency, frequency of urination, constipation, insomnia, blurred vision, wheezing, eye itching, appetite change, chills  ALLERGIES: Allergies  Allergen Reactions  . Codeine   . Prozac [Fluoxetine Hcl]     Hives    HOME MEDICATIONS: Outpatient Medications Prior to Visit  Medication Sig Dispense Refill  . acetaminophen (TYLENOL) 325 MG tablet Take 650 mg by mouth as needed.    . Calcium-Phosphorus-Vitamin D (CALCIUM GUMMIES PO) Take by mouth.    . cetirizine (ZYRTEC) 10 MG tablet Take 10 mg by mouth daily.    . Cholecalciferol (VITAMIN D3 ADULT GUMMIES PO) Take by mouth.    . escitalopram (LEXAPRO) 10 MG tablet Take 1 tablet (10 mg total) by mouth daily. 30 tablet 3  . fluticasone (FLONASE) 50 MCG/ACT nasal spray     .  interferon beta-1a (REBIF) 44 MCG/0.5ML SOSY injection INJECT SUBCUTANEOUSLY THREE TIMES A WEEK 36 Syringe 3  . magnesium gluconate (MAGONATE) 500 MG tablet Take 500 mg by mouth 2 (two) times daily.    . meloxicam (MOBIC) 15 MG tablet Take 1 tablet (15 mg total) by mouth daily. 30 tablet 1  . methocarbamol (ROBAXIN) 500 MG tablet Take 1 tablet (500 mg total) by mouth 3 (three) times daily. 90 tablet 0  . mometasone-formoterol (DULERA) 100-5  MCG/ACT AERO Inhale 2 puffs into the lungs 2 (two) times daily as needed for wheezing.    . Multiple Vitamins-Minerals (MULTIVITAMIN GUMMIES ADULT PO) Take by mouth.    . oxybutynin (DITROPAN-XL) 5 MG 24 hr tablet Take 1 tablet (5 mg total) by mouth at bedtime. 30 tablet 5  . Probiotic Product (PROBIOTIC DAILY PO) Take by mouth.    . valACYclovir (VALTREX) 500 MG tablet Take 500 mg by mouth daily.      No facility-administered medications prior to visit.     PAST MEDICAL HISTORY: Past Medical History:  Diagnosis Date  . Asthma   . Dyslipidemia   . Genital herpes   . Multiple sclerosis (HCC) 04/22/2013    PAST SURGICAL HISTORY: Past Surgical History:  Procedure Laterality Date  . ABDOMINAL HYSTERECTOMY    . BREAST FIBROADENOMA SURGERY      FAMILY HISTORY: Family History  Problem Relation Age of Onset  . Hypertension Mother   . Asthma Maternal Grandmother   . Cancer Maternal Grandfather     throat cancer    SOCIAL HISTORY: Social History   Social History  . Marital status: Single    Spouse name: N/A  . Number of children: 0  . Years of education: college   Occupational History  . Teacher     [employer]   Social History Main Topics  . Smoking status: Former Games developer  . Smokeless tobacco: Never Used  . Alcohol use Yes     Comment: occasional  . Drug use: No  . Sexual activity: Not on file   Other Topics Concern  . Not on file   Social History Narrative   Lives at home w/ a friend   Right-handed   Occasional caffeine (~ once per week)      PHYSICAL EXAM  Vitals:   07/25/16 1017  BP: (!) 148/80  Pulse: 80  Weight: 148 lb 9.6 oz (67.4 kg)  Height: 5\' 3"  (1.6 m)   Body mass index is 26.32 kg/m.  Generalized: Well developed, in no acute distress   Neurological examination  Mentation: Alert oriented to time, place, history taking. Follows all commands speech and language fluent Cranial nerve II-XII: Pupils were equal round reactive to  light. Extraocular movements were full, visual field were full on confrontational test. Facial sensation and strength were normal. Uvula tongue midline. Head turning and shoulder shrug  were normal and symmetric. Motor: The motor testing reveals 5 over 5 strength of all 4 extremities. Good symmetric motor tone is noted throughout.  Sensory: Sensory testing is intact to soft touch on all 4 extremities. No evidence of extinction is noted.  Coordination: Cerebellar testing reveals good finger-nose-finger and heel-to-shin bilaterally.  Gait and station: Gait is normal. Tandem gait is normal. Romberg is negative. No drift is seen.  Reflexes: Deep tendon reflexes are symmetric and normal bilaterally.   DIAGNOSTIC DATA (LABS, IMAGING, TESTING) - I reviewed patient records, labs, notes, testing and imaging myself where available.  Lab Results  Component  Value Date   WBC 9.0 04/21/2014   HGB 12.8 04/21/2014   HCT 37.8 04/21/2014   MCV 91 04/21/2014   PLT 295 04/21/2014      Component Value Date/Time   NA 144 04/21/2014 1632   K 5.4 (H) 04/21/2014 1632   CL 102 04/21/2014 1632   CO2 27 04/21/2014 1632   GLUCOSE 104 (H) 04/21/2014 1632   BUN 14 04/21/2014 1632   CREATININE 0.78 04/21/2014 1632   CALCIUM 10.2 04/21/2014 1632   PROT 7.3 04/21/2014 1632   ALBUMIN 4.4 04/21/2014 1632   AST 20 04/21/2014 1632   ALT 20 04/21/2014 1632   ALKPHOS 98 04/21/2014 1632   BILITOT <0.2 04/21/2014 1632   GFRNONAA 93 04/21/2014 1632   GFRAA 107 04/21/2014 1632      ASSESSMENT AND PLAN 46 y.o. year old female  has a past medical history of Asthma; Dyslipidemia; Genital herpes; and Multiple sclerosis (HCC) (04/22/2013). here with:  1. Multiple sclerosis 2. Neck pain 3. Anxiety  Overall the patient has remained stable. She will continue on Rebif. We will recheck an MRI of the brain to look for any new lesions related to multiple sclerosis. She also continues to have some mild neck pain we will check  an MRI of the cervical spine to look for disc herniation, stenosis or lesions related to multiple sclerosis. For now she will hold off on Lexapro for anxiety until after she follows up with dermatology. I will also check blood work today. She is advised that if her symptoms worsen or she develops new symptoms she should let us know. She will follow-up in 6 months or sooner if needed.     Butch Penny, MSN, NP-C 07/25/2016, 10:05 AM [facility] [address], [address] [place], [place] [address] [phone]

## 2016-07-26 ENCOUNTER — Telehealth: Payer: Self-pay | Admitting: *Deleted

## 2016-07-26 ENCOUNTER — Telehealth: Payer: Self-pay | Admitting: Adult Health

## 2016-07-26 LAB — COMPREHENSIVE METABOLIC PANEL
ALBUMIN: 4.4 g/dL (ref 3.5–5.5)
ALK PHOS: 88 IU/L (ref 39–117)
ALT: 17 IU/L (ref 0–32)
AST: 20 IU/L (ref 0–40)
Albumin/Globulin Ratio: 1.6 (ref 1.2–2.2)
BUN / CREAT RATIO: 20 (ref 9–23)
BUN: 12 mg/dL (ref 6–24)
CO2: 28 mmol/L (ref 18–29)
Calcium: 9.6 mg/dL (ref 8.7–10.2)
Chloride: 102 mmol/L (ref 96–106)
Creatinine, Ser: 0.59 mg/dL (ref 0.57–1.00)
GFR calc Af Amer: 127 mL/min/{1.73_m2} (ref >59)
GFR calc non Af Amer: 110 mL/min/{1.73_m2} (ref >59)
GLOBULIN, TOTAL: 2.7 g/dL (ref 1.5–4.5)
GLUCOSE: 94 mg/dL (ref 65–99)
POTASSIUM: 5.2 mmol/L (ref 3.5–5.2)
SODIUM: 142 mmol/L (ref 134–144)
Total Protein: 7.1 g/dL (ref 6.0–8.5)

## 2016-07-26 LAB — CBC WITH DIFFERENTIAL/PLATELET
BASOS: 0 %
Basophils Absolute: 0 10*3/uL (ref 0.0–0.2)
EOS (ABSOLUTE): 0.1 10*3/uL (ref 0.0–0.4)
Eos: 2 %
Hematocrit: 36.8 % (ref 34.0–46.6)
Hemoglobin: 12.4 g/dL (ref 11.1–15.9)
IMMATURE GRANULOCYTES: 0 %
Immature Grans (Abs): 0 10*3/uL (ref 0.0–0.1)
Lymphocytes Absolute: 2.5 10*3/uL (ref 0.7–3.1)
Lymphs: 37 %
MCH: 30.7 pg (ref 26.6–33.0)
MCHC: 33.7 g/dL (ref 31.5–35.7)
MCV: 91 fL (ref 79–97)
MONOS ABS: 0.4 10*3/uL (ref 0.1–0.9)
Monocytes: 6 %
NEUTROS PCT: 55 %
Neutrophils Absolute: 3.8 10*3/uL (ref 1.4–7.0)
PLATELETS: 230 10*3/uL (ref 150–379)
RBC: 4.04 x10E6/uL (ref 3.77–5.28)
RDW: 14.6 % (ref 12.3–15.4)
WBC: 6.8 10*3/uL (ref 3.4–10.8)

## 2016-07-26 NOTE — Telephone Encounter (Signed)
07/25/16 [employer] Auth: [id number] (exp. 07/25/16 to 08/23/16) EE 07/26/16 spoke w/pt she stated she cant afford the images right now due to her Ded not being met she will call me when she is ready to schedule the MRI's EE

## 2016-07-26 NOTE — Telephone Encounter (Signed)
-----   Message from Megan Millikan, NP sent at 07/26/2016 11:48 AM EDT ----- Blood work is unremarkable. Please call patient. 

## 2016-07-26 NOTE — Telephone Encounter (Signed)
Spoke to pt and relayed that her labs were unremarkable.  She verbalized understanding.

## 2016-08-10 ENCOUNTER — Other Ambulatory Visit: Payer: BC Managed Care – PPO

## 2016-10-05 ENCOUNTER — Other Ambulatory Visit: Payer: Self-pay | Admitting: Neurology

## 2016-10-10 ENCOUNTER — Telehealth: Payer: Self-pay | Admitting: Neurology

## 2016-10-10 NOTE — Telephone Encounter (Signed)
Patient called and requested a refill on her medication of Rx Rebif be sent to [facility]. Please call and advise.

## 2016-10-10 NOTE — Telephone Encounter (Signed)
Called pt back. Went to VM. Asked pt to call office back.   If patient calls, please advise her that refills sent already on 10/06/16. All she has to do is call [phone] (Phone) and verify shipment. [facility] has been trying to contact her.

## 2016-10-10 NOTE — Telephone Encounter (Signed)
Called and spoke with Gust Rung [facility] They have refill sent on 10/06/16. Patient needs to call and verify shipping information. They have tried calling pt several times, unable to reach.  Advised I will call patient and advise her to call them.

## 2016-10-11 NOTE — Telephone Encounter (Signed)
Called and LVM for pt again, asked her to call office back.  Please relay message below if she calls

## 2016-10-11 NOTE — Telephone Encounter (Signed)
Patient called office to return RN's call.  I have advised patient of message below and gave her the number to [facility] to call and verify shipment.

## 2016-10-12 NOTE — Telephone Encounter (Signed)
Noted, thank you

## 2017-02-03 ENCOUNTER — Ambulatory Visit: Payer: BC Managed Care – PPO | Admitting: Neurology

## 2017-02-10 ENCOUNTER — Ambulatory Visit
Admission: RE | Admit: 2017-02-10 | Discharge: 2017-02-10 | Disposition: A | Discharge status: Home / Self Care | Payer: BC Managed Care – PPO | Source: Ambulatory Visit | Attending: Neurology | Admitting: Neurology

## 2017-02-10 ENCOUNTER — Encounter: Payer: Self-pay | Admitting: Neurology

## 2017-02-10 ENCOUNTER — Ambulatory Visit: Payer: BC Managed Care – PPO | Admitting: Neurology

## 2017-02-10 ENCOUNTER — Telehealth: Payer: Self-pay | Admitting: Neurology

## 2017-02-10 VITALS — BP 150/96 | HR 77 | Ht 63.0 in | Wt 148.5 lb

## 2017-02-10 DIAGNOSIS — M25552 Pain in left hip: Secondary | ICD-10-CM

## 2017-02-10 DIAGNOSIS — G35 Multiple sclerosis: Secondary | ICD-10-CM

## 2017-02-10 DIAGNOSIS — Z5181 Encounter for therapeutic drug level monitoring: Secondary | ICD-10-CM | POA: Diagnosis not present

## 2017-02-10 NOTE — Progress Notes (Signed)
Reason for visit: Multiple sclerosis  Sheri Dickerson is an 46 y.o. female  History of present illness:  Sheri Dickerson is a 46 year old right-handed black female with a history of multiple sclerosis. She is on Rebif and she is tolerating the drug well.  She has not had any new symptoms of numbness, weakness, vision changes, or changes in bowel or bladder function.  She does have some urgency and incontinence of the bladder, but she went off of her oxybutynin within the last several months.  The patient reports some problems with constipation.  The patient is having some left hip discomfort that is worse when she is trying to go up a flight of stairs.  The patient also has some intermittent left neck discomfort.  Ibuprofen will help this discomfort.  The patient has some mild balance issues, she denies any falls.  She recently had some troubles with bedbugs, she had to move out of her apartment.  Past Medical History:  Diagnosis Date  . Asthma   . Dyslipidemia   . Genital herpes   . Multiple sclerosis (HCC) 04/22/2013    Past Surgical History:  Procedure Laterality Date  . ABDOMINAL HYSTERECTOMY    . BREAST FIBROADENOMA SURGERY      Family History  Problem Relation Age of Onset  . Hypertension Mother   . Asthma Maternal Grandmother   . Cancer Maternal Grandfather        throat cancer    Social history:  reports that she has quit smoking. she has never used smokeless tobacco. She reports that she drinks alcohol. She reports that she does not use drugs.    Allergies  Allergen Reactions  . Codeine   . Prozac [Fluoxetine Hcl]     Hives    Medications:  Prior to Admission medications   Medication Sig Start Date End Date Taking? Authorizing Provider  acetaminophen (TYLENOL) 325 MG tablet Take 650 mg by mouth as needed.   Yes [provider]  cetirizine (ZYRTEC) 10 MG tablet Take 10 mg by mouth daily.   Yes [provider]  Cholecalciferol (VITAMIN D3 ADULT  GUMMIES PO) Take by mouth.   Yes [provider]  escitalopram (LEXAPRO) 10 MG tablet Take 1 tablet (10 mg total) by mouth daily. 06/30/16  Yes York Spaniel, MD  FLUoxetine (PROZAC) 10 MG capsule Take 10 mg daily by mouth.   Yes [provider]  fluticasone Aleda Grana) 50 MCG/ACT nasal spray  05/11/13  Yes [provider]  magnesium gluconate (MAGONATE) 500 MG tablet Take 500 mg by mouth 2 (two) times daily.   Yes [provider]  mometasone-formoterol (DULERA) 100-5 MCG/ACT AERO Inhale 2 puffs into the lungs 2 (two) times daily as needed for wheezing.   Yes [provider]  Multiple Vitamins-Minerals (MULTIVITAMIN GUMMIES ADULT PO) Take by mouth.   Yes [provider]  Probiotic Product (PROBIOTIC DAILY PO) Take by mouth.   Yes [provider]  REBIF 44 MCG/0.5ML SOSY injection INJECT SUBCUTANEOUSLY THREE TIMES A WEEK 10/06/16  Yes York Spaniel, MD  valACYclovir (VALTREX) 500 MG tablet Take 500 mg by mouth daily.    Yes [provider]  meloxicam (MOBIC) 15 MG tablet Take 1 tablet (15 mg total) by mouth daily. Patient not taking: Reported on 02/10/2017 01/22/16   York Spaniel, MD  methocarbamol (ROBAXIN) 500 MG tablet Take 1 tablet (500 mg total) by mouth 3 (three) times daily. Patient not taking: Reported on  02/10/2017 01/22/16   York SpanielWillis, Masako Overall K, MD  oxybutynin (DITROPAN-XL) 5 MG 24 hr tablet Take 1 tablet (5 mg total) by mouth at bedtime. Patient not taking: Reported on 02/10/2017 07/23/15   Butch PennyMillikan, Megan, NP    ROS:  Out of a complete 14 system review of symptoms, the patient complains only of the following symptoms, and all other reviewed systems are negative.  Left hip pain  Blood pressure (!) 150/96, pulse 77, height 5\' 3"  (1.6 m), weight 148 lb 8 oz (67.4 kg).  Physical Exam  General: The patient is alert and cooperative at the time of the examination.  Skin: No significant peripheral edema  is noted.   Neurologic Exam  Mental status: The patient is alert and oriented x 3 at the time of the examination. The patient has apparent normal recent and remote memory, with an apparently normal attention span and concentration ability.   Cranial nerves: Facial symmetry is present. Speech is normal, no aphasia or dysarthria is noted. Extraocular movements are full. Visual fields are full.  Pupils are equal, round, and reactive to light.  Discs are flat bilaterally.  Motor: The patient has good strength in all 4 extremities.  Sensory examination: Soft touch sensation is symmetric on the face, arms, and legs.  Coordination: The patient has good finger-nose-finger and heel-to-shin bilaterally.  Gait and station: The patient has a normal gait. Tandem gait is normal. Romberg is negative. No drift is seen.  Reflexes: Deep tendon reflexes are symmetric.   Assessment/Plan:  1.  Multiple sclerosis  2.  Left hip, left neck pain  The patient will be set up for MRI of the brain and cervical spine, this was ordered on her last visit but she never had the studies done.  Blood work will be done today.  The patient will continue the Rebif, she will be sent for x-ray of the left hip.  She will go on ibuprofen 600 mg 3 times daily for 6 weeks.  If the hip pain does not improve, we will consider a referral to orthopedic surgery.  She will follow-up in 6 months.  She will go back on the oxybutynin for her bladder.  She will go on vitamin D supplementation 1000 international units daily.  Marlan Palau. Keith Persais Ethridge MD 02/10/2017 9:05 AM  [facility] [address] [place], [place] [id number]  Phone [phone] Fax [phone]

## 2017-02-10 NOTE — Telephone Encounter (Signed)
I called the patient.  The patient has bursitis involving the left hip, if the ibuprofen does not help, I will make a referral to an orthopedic surgeon.   XR left hip 02/10/17:  IMPRESSION: Calcific bursitis greater trochanter.

## 2017-02-10 NOTE — Patient Instructions (Signed)
   We will get XR of the left hip, and get MRI of the brain and cervical spine.

## 2017-02-11 LAB — COMPREHENSIVE METABOLIC PANEL
ALBUMIN: 4.5 g/dL (ref 3.5–5.5)
ALT: 13 IU/L (ref 0–32)
AST: 19 IU/L (ref 0–40)
Albumin/Globulin Ratio: 1.7 (ref 1.2–2.2)
Alkaline Phosphatase: 83 IU/L (ref 39–117)
BILIRUBIN TOTAL: 0.2 mg/dL (ref 0.0–1.2)
BUN / CREAT RATIO: 17 (ref 9–23)
BUN: 11 mg/dL (ref 6–24)
CHLORIDE: 102 mmol/L (ref 96–106)
CO2: 26 mmol/L (ref 20–29)
Calcium: 9.7 mg/dL (ref 8.7–10.2)
Creatinine, Ser: 0.63 mg/dL (ref 0.57–1.00)
GFR calc non Af Amer: 108 mL/min/{1.73_m2} (ref >59)
GFR, EST AFRICAN AMERICAN: 124 mL/min/{1.73_m2} (ref >59)
GLUCOSE: 78 mg/dL (ref 65–99)
Globulin, Total: 2.7 g/dL (ref 1.5–4.5)
Potassium: 4.4 mmol/L (ref 3.5–5.2)
Sodium: 142 mmol/L (ref 134–144)
TOTAL PROTEIN: 7.2 g/dL (ref 6.0–8.5)

## 2017-02-11 LAB — CBC WITH DIFFERENTIAL/PLATELET
BASOS ABS: 0 10*3/uL (ref 0.0–0.2)
Basos: 0 %
EOS (ABSOLUTE): 0.1 10*3/uL (ref 0.0–0.4)
EOS: 1 %
HEMATOCRIT: 35.1 % (ref 34.0–46.6)
HEMOGLOBIN: 12 g/dL (ref 11.1–15.9)
Immature Grans (Abs): 0 10*3/uL (ref 0.0–0.1)
Immature Granulocytes: 0 %
LYMPHS ABS: 2 10*3/uL (ref 0.7–3.1)
Lymphs: 36 %
MCH: 30.8 pg (ref 26.6–33.0)
MCHC: 34.2 g/dL (ref 31.5–35.7)
MCV: 90 fL (ref 79–97)
MONOCYTES: 10 %
Monocytes Absolute: 0.6 10*3/uL (ref 0.1–0.9)
NEUTROS ABS: 3 10*3/uL (ref 1.4–7.0)
Neutrophils: 53 %
Platelets: 219 10*3/uL (ref 150–379)
RBC: 3.89 x10E6/uL (ref 3.77–5.28)
RDW: 14.6 % (ref 12.3–15.4)
WBC: 5.7 10*3/uL (ref 3.4–10.8)

## 2017-02-13 ENCOUNTER — Telehealth: Payer: Self-pay | Admitting: *Deleted

## 2017-02-13 NOTE — Telephone Encounter (Signed)
Called and spoke with patient about unremarkable labs per CW,MD note. She verbalized understanding.  

## 2017-02-13 NOTE — Telephone Encounter (Signed)
-----   Message from Charles K Willis, MD sent at 02/11/2017  1:25 PM EST -----   The blood work results are unremarkable. Please call the patient.  ----- Message ----- From: Interface, [facility] Lab Results In Sent: 02/11/2017   5:42 AM To: Charles K Willis, MD   

## 2017-03-15 ENCOUNTER — Other Ambulatory Visit: Payer: BC Managed Care – PPO

## 2017-04-17 ENCOUNTER — Telehealth: Payer: Self-pay | Admitting: Neurology

## 2017-04-17 ENCOUNTER — Ambulatory Visit
Admission: RE | Admit: 2017-04-17 | Discharge: 2017-04-17 | Disposition: A | Discharge status: Home / Self Care | Payer: BC Managed Care – PPO | Source: Ambulatory Visit | Attending: Neurology | Admitting: Neurology

## 2017-04-17 DIAGNOSIS — M542 Cervicalgia: Principal | ICD-10-CM

## 2017-04-17 DIAGNOSIS — G35 Multiple sclerosis: Secondary | ICD-10-CM

## 2017-04-17 DIAGNOSIS — G8929 Other chronic pain: Secondary | ICD-10-CM

## 2017-04-17 MED ORDER — GADOBENATE DIMEGLUMINE 529 MG/ML IV SOLN
15.0000 mL | Freq: Once | INTRAVENOUS | Status: AC | PRN
  Administered 2017-04-17: 15 mL via INTRAVENOUS

## 2017-04-17 NOTE — Telephone Encounter (Signed)
   I called the patient.  MRI of the brain appears to be stable from 2016.  MRI of the cervical spine shows a questionable lesion in the left C5 level.  Everything appears to be chronic.  The patient does have an left-sided neck pain that is ongoing, we will set her up for neuromuscular therapy.  She does have some mild degenerative changes at C3-4 and C5-6 levels.  No evidence of nerve root impingement.  MRI brain 04/17/17:  IMPRESSION:  This MRI of the brain with and without contrast shows the following: 1.    Multiple T2/FLAIR hyperintense foci in the periventricular, juxtacortical and deep white matter of both hemispheres and in the right lateral pons in a pattern and configuration consistent with chronic demyelinating plaque associated with multiple sclerosis. None of the foci appears to be acute. When compared to the MRI dated 05/22/2014, there is no definite interval change. 2.    Mild left ethmoid chronic sinusitis. 3.    There are no acute findings and there is a normal enhancement pattern.   MRI cervical 04/17/17:  IMPRESSION:  This MRI of the cervical spine with and without contrast shows the following:  1.   There is one small T2 hyperintense focus posteriorly within the spinal cord to the left adjacent to the C5 vertebral body. This is consistent with a chronic demyelinating plaque associated with multiple sclerosis. 2.    There are mild degenerative changes at C3-C4 and a small central disc protrusion at C5-C6. These do not lead to any foraminal narrowing or nerve root compression. 3.    After the infusion of contrast, there is a normal enhancement pattern.

## 2017-08-17 ENCOUNTER — Ambulatory Visit: Payer: BC Managed Care – PPO | Admitting: Adult Health

## 2017-09-05 ENCOUNTER — Ambulatory Visit: Payer: BC Managed Care – PPO | Admitting: Adult Health

## 2017-09-14 ENCOUNTER — Other Ambulatory Visit: Payer: Self-pay | Admitting: Neurology

## 2017-10-02 ENCOUNTER — Encounter: Payer: Self-pay | Admitting: Adult Health

## 2017-10-02 ENCOUNTER — Ambulatory Visit: Payer: BC Managed Care – PPO | Admitting: Adult Health

## 2017-10-02 VITALS — BP 122/78 | HR 75 | Ht 63.5 in | Wt 152.0 lb

## 2017-10-02 DIAGNOSIS — M7918 Myalgia, other site: Secondary | ICD-10-CM

## 2017-10-02 DIAGNOSIS — G35 Multiple sclerosis: Secondary | ICD-10-CM | POA: Diagnosis not present

## 2017-10-02 DIAGNOSIS — Z5181 Encounter for therapeutic drug level monitoring: Secondary | ICD-10-CM

## 2017-10-02 MED ORDER — METHOCARBAMOL 500 MG PO TABS: 500.0000 mg | ORAL_TABLET | Freq: Three times a day (TID) | ORAL | 3 refills | Status: DC

## 2017-10-02 NOTE — Progress Notes (Signed)
PATIENT: Sheri Dickerson DOB: 03/29/1970  REASON FOR VISIT: follow up HISTORY FROM: patient  HISTORY OF PRESENT ILLNESS: Today 10/02/17: Ms. Sheri Dickerson is a 47 year old female with a history of multiple sclerosis.  She returns today for follow-up.  She remains on with this and is tolerating it well.  She reports that she occasionally has some numbness in the hands and feet.  This is not continuous.  She also reports back pain that includes the entire spine.  However she feels that it more related to the paraspinal muscles.  She states that this pain is worse with activity or after sleeping.  She denies any significant changes with her gait or balance.  Reports on occasion she will stumble.  Denies any changes with her vision.  She does report that she is always cold.  She does have some incontinence of the bladder.  Reports that she was not taking oxybutynin but has started back this week.  She is currently in physical therapy through her primary care provider.  She returns today for evaluation.  HISTORY 02/10/2017: Ms. Sheri Dickerson is a 47 year old right-handed black female with a history of multiple sclerosis. She is on Rebif and she is tolerating the drug well.  She has not had any new symptoms of numbness, weakness, vision changes, or changes in bowel or bladder function.  She does have some urgency and incontinence of the bladder, but she went off of her oxybutynin within the last several months.  The patient reports some problems with constipation.  The patient is having some left hip discomfort that is worse when she is trying to go up a flight of stairs.  The patient also has some intermittent left neck discomfort.  Ibuprofen will help this discomfort.  The patient has some mild balance issues, she denies any falls.  She recently had some troubles with bedbugs, she had to move out of her apartment.   REVIEW OF SYSTEMS: Out of a complete 14 system review of symptoms, the patient complains only of the  following symptoms, and all other reviewed systems are negative.  See HPI  ALLERGIES: Allergies  Allergen Reactions  . Codeine   . Prozac [Fluoxetine Hcl]     Hives    HOME MEDICATIONS: Outpatient Medications Prior to Visit  Medication Sig Dispense Refill  . acetaminophen (TYLENOL) 325 MG tablet Take 650 mg by mouth as needed.    . busPIRone (BUSPAR) 10 MG tablet Take 10 mg by mouth 2 (two) times daily.  3  . cetirizine (ZYRTEC) 10 MG tablet Take 10 mg by mouth daily.    Marland Kitchen escitalopram (LEXAPRO) 10 MG tablet Take 10 mg by mouth daily.  2  . fluticasone (FLONASE) 50 MCG/ACT nasal spray     . hydrochlorothiazide (HYDRODIURIL) 25 MG tablet Take 25 mg by mouth daily.  2  . magnesium gluconate (MAGONATE) 500 MG tablet Take 500 mg by mouth 2 (two) times daily.    . meloxicam (MOBIC) 15 MG tablet Take 1 tablet (15 mg total) by mouth daily. 30 tablet 1  . methocarbamol (ROBAXIN) 500 MG tablet Take 1 tablet (500 mg total) by mouth 3 (three) times daily. 90 tablet 0  . Multiple Vitamins-Minerals (MULTIVITAMIN GUMMIES ADULT PO) Take by mouth.    . oxybutynin (DITROPAN-XL) 5 MG 24 hr tablet Take 1 tablet (5 mg total) by mouth at bedtime. 30 tablet 5  . REBIF 44 MCG/0.5ML SOSY injection INJECT ONE SYRINGE (44 MCG) SUBCUTANEOUSLY THREE TIMES PER WEEK. KEEPREFRIGERATED.  36 Syringe 3  . valACYclovir (VALTREX) 500 MG tablet Take 500 mg by mouth daily.     . Cholecalciferol (VITAMIN D3 ADULT GUMMIES PO) Take by mouth.    Marland Kitchen FLUoxetine (PROZAC) 10 MG capsule Take 10 mg daily by mouth.    . mometasone-formoterol (DULERA) 100-5 MCG/ACT AERO Inhale 2 puffs into the lungs 2 (two) times daily as needed for wheezing.    . Probiotic Product (PROBIOTIC DAILY PO) Take by mouth.     No facility-administered medications prior to visit.     PAST MEDICAL HISTORY: Past Medical History:  Diagnosis Date  . Asthma   . Dyslipidemia   . Genital herpes   . Multiple sclerosis (HCC) 04/22/2013    PAST SURGICAL  HISTORY: Past Surgical History:  Procedure Laterality Date  . ABDOMINAL HYSTERECTOMY    . BREAST FIBROADENOMA SURGERY      FAMILY HISTORY: Family History  Problem Relation Age of Onset  . Hypertension Mother   . Asthma Maternal Grandmother   . Cancer Maternal Grandfather        throat cancer    SOCIAL HISTORY: Social History   Socioeconomic History  . Marital status: Single    Spouse name: Not on file  . Number of children: 0  . Years of education: college  . Highest education level: Not on file  Occupational History  . 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 Financial resource strain: Not on file  . Food insecurity:    Worry: Not on file    Inability: Not on file  . Transportation needs:    Medical: Not on file    Non-medical: Not on file  Tobacco Use  . Smoking status: Former Games developer  . Smokeless tobacco: Never Used  Substance and Sexual Activity  . Alcohol use: Yes    Comment: occasional  . Drug use: No  . Sexual activity: Not on file  Lifestyle  . Physical activity:    Days per week: Not on file    Minutes per session: Not on file  . Stress: Not on file  Relationships  . Social connections:    Talks on phone: Not on file    Gets together: Not on file    Attends religious service: Not on file    Active member of club or organization: Not on file    Attends meetings of clubs or organizations: Not on file    Relationship status: Not on file  . Intimate partner violence:    Fear of current or ex partner: Not on file    Emotionally abused: Not on file    Physically abused: Not on file    Forced sexual activity: Not on file  Other Topics Concern  . Not on file  Social History Narrative   Lives at home w/ a friend   Right-handed   Occasional caffeine (~ once per week)      PHYSICAL EXAM  Vitals:   10/02/17 1032  BP: 122/78  Pulse: 75  Weight: 152 lb (68.9 kg)  Height: 5' 3.5" (1.613 m)   Body mass index is  26.5 kg/m.  Generalized: Well developed, in no acute distress   Neurological examination  Mentation: Alert oriented to time, place, history taking. Follows all commands speech and language fluent Cranial nerve II-XII: Pupils were equal round reactive to light. Extraocular movements were full, visual field were full on confrontational test. Facial sensation and strength were normal. Uvula tongue midline. Head  turning and shoulder shrug  were normal and symmetric. Motor: The motor testing reveals 5 over 5 strength of all 4 extremities. Good symmetric motor tone is noted throughout.  Sensory: Sensory testing is intact to soft touch on all 4 extremities. No evidence of extinction is noted.  Coordination: Cerebellar testing reveals good finger-nose-finger and heel-to-shin bilaterally.  Gait and station: Gait is normal. Tandem gait is normal. Romberg is negative. No drift is seen.  Reflexes: Deep tendon reflexes are symmetric and normal bilaterally.   DIAGNOSTIC DATA (LABS, IMAGING, TESTING) - I reviewed patient records, labs, notes, testing and imaging myself where available.  Lab Results  Component Value Date   WBC 5.7 02/10/2017   HGB 12.0 02/10/2017   HCT 35.1 02/10/2017   MCV 90 02/10/2017   PLT 219 02/10/2017      Component Value Date/Time   NA 142 02/10/2017 0933   K 4.4 02/10/2017 0933   CL 102 02/10/2017 0933   CO2 26 02/10/2017 0933   GLUCOSE 78 02/10/2017 0933   BUN 11 02/10/2017 0933   CREATININE 0.63 02/10/2017 0933   CALCIUM 9.7 02/10/2017 0933   PROT 7.2 02/10/2017 0933   ALBUMIN 4.5 02/10/2017 0933   AST 19 02/10/2017 0933   ALT 13 02/10/2017 0933   ALKPHOS 83 02/10/2017 0933   BILITOT 0.2 02/10/2017 0933   GFRNONAA 108 02/10/2017 0933   GFRAA 124 02/10/2017 0933   MRI cervical spine with and without contrast April 17, 2017:  IMPRESSION:  This MRI of the cervical spine with and without contrast shows the following:  1.   There is one small T2 hyperintense  focus posteriorly within the spinal cord to the left adjacent to the C5 vertebral body. This is consistent with a chronic demyelinating plaque associated with multiple sclerosis. 2.    There are mild degenerative changes at C3-C4 and a small central disc protrusion at C5-C6. These do not lead to any foraminal narrowing or nerve root compression. 3.    After the infusion of contrast, there is a normal enhancement pattern.  MR BRAIN W WO CONTRAST 04/17/17:   IMPRESSION:  This MRI of the brain with and without contrast shows the following: 1.    Multiple T2/FLAIR hyperintense foci in the periventricular, juxtacortical and deep white matter of both hemispheres and in the right lateral pons in a pattern and configuration consistent with chronic demyelinating plaque associated with multiple sclerosis. None of the foci appears to be acute. When compared to the MRI dated 05/22/2014, there is no definite interval change. 2.    Mild left ethmoid chronic sinusitis. 3.    There are no acute findings and there is a normal enhancement pattern.    ASSESSMENT AND PLAN 47 y.o. year old female  has a past medical history of Asthma, Dyslipidemia, Genital herpes, and Multiple sclerosis (HCC) (04/22/2013). here with :  1.  Multiple sclerosis 2.  Back pain  The patient will continue on Requip.  I will check blood work today.  The patient reports discomfort in the back most likely muscle spasms.  I will restart Robaxin 500 mg 3 times a day as needed.  Advised that if her symptoms worsen or she develops new symptoms she should let us know.  She will follow-up in 6 months or sooner if needed.   Butch Penny, MSN, NP-C 10/02/2017, 10:59 AM [facility] [address] [place], [place] [address] [phone]

## 2017-10-02 NOTE — Progress Notes (Signed)
I have read the note, and I agree with the clinical assessment and plan.  Charles K Willis   

## 2017-10-02 NOTE — Patient Instructions (Signed)
Your Plan:  Continue Rebif Blood work today Start Robaxin If your symptoms worsen or you develop new symptoms please let us know.    Thank you for coming to see [place] at [facility]. I hope we have been able to provide you high quality care today.  You may receive a patient satisfaction survey over the next few weeks. We would appreciate your feedback and comments so that we may continue to improve ourselves and the health of our patients.

## 2017-10-03 ENCOUNTER — Telehealth: Payer: Self-pay | Admitting: Neurology

## 2017-10-03 LAB — COMPREHENSIVE METABOLIC PANEL
ALT: 16 IU/L (ref 0–32)
AST: 18 IU/L (ref 0–40)
Albumin/Globulin Ratio: 1.7 (ref 1.2–2.2)
Albumin: 4.4 g/dL (ref 3.5–5.5)
Alkaline Phosphatase: 83 IU/L (ref 39–117)
BUN/Creatinine Ratio: 16 (ref 9–23)
BUN: 12 mg/dL (ref 6–24)
Bilirubin Total: <0.2 mg/dL (ref 0.0–1.2)
CALCIUM: 9.9 mg/dL (ref 8.7–10.2)
CO2: 27 mmol/L (ref 20–29)
CREATININE: 0.76 mg/dL (ref 0.57–1.00)
Chloride: 99 mmol/L (ref 96–106)
GFR calc Af Amer: 108 mL/min/{1.73_m2} (ref >59)
GFR, EST NON AFRICAN AMERICAN: 94 mL/min/{1.73_m2} (ref >59)
GLOBULIN, TOTAL: 2.6 g/dL (ref 1.5–4.5)
Glucose: 78 mg/dL (ref 65–99)
Potassium: 4.4 mmol/L (ref 3.5–5.2)
SODIUM: 143 mmol/L (ref 134–144)
Total Protein: 7 g/dL (ref 6.0–8.5)

## 2017-10-03 LAB — CBC WITH DIFFERENTIAL/PLATELET
BASOS: 0 %
Basophils Absolute: 0 10*3/uL (ref 0.0–0.2)
EOS (ABSOLUTE): 0.1 10*3/uL (ref 0.0–0.4)
EOS: 2 %
HEMATOCRIT: 38.7 % (ref 34.0–46.6)
HEMOGLOBIN: 12.3 g/dL (ref 11.1–15.9)
IMMATURE GRANS (ABS): 0 10*3/uL (ref 0.0–0.1)
Immature Granulocytes: 0 %
LYMPHS: 28 %
Lymphocytes Absolute: 1.8 10*3/uL (ref 0.7–3.1)
MCH: 30.5 pg (ref 26.6–33.0)
MCHC: 31.8 g/dL (ref 31.5–35.7)
MCV: 96 fL (ref 79–97)
Monocytes Absolute: 0.3 10*3/uL (ref 0.1–0.9)
Monocytes: 5 %
NEUTROS PCT: 65 %
Neutrophils Absolute: 4.2 10*3/uL (ref 1.4–7.0)
Platelets: 251 10*3/uL (ref 150–450)
RBC: 4.03 x10E6/uL (ref 3.77–5.28)
RDW: 15 % (ref 12.3–15.4)
WBC: 6.4 10*3/uL (ref 3.4–10.8)

## 2017-10-03 NOTE — Telephone Encounter (Signed)
Called the patient and made her aware that her lab work was normal and there were no concerns. Pt verbalized understanding. Pt had no questions at this time but was encouraged to call back if questions arise.

## 2017-10-03 NOTE — Telephone Encounter (Signed)
-----   Message from Butch Penny, NP sent at 10/03/2017  8:10 AM EDT ----- Blood work unremarkable. Please call patient.

## 2017-11-23 ENCOUNTER — Ambulatory Visit: Payer: BC Managed Care – PPO | Admitting: Adult Health

## 2017-12-07 ENCOUNTER — Emergency Department (HOSPITAL_COMMUNITY)
Admission: EM | Admit: 2017-12-07 | Discharge: 2017-12-07 | Disposition: A | Discharge status: Home / Self Care | Payer: BC Managed Care – PPO | Attending: Emergency Medicine | Admitting: Emergency Medicine

## 2017-12-07 ENCOUNTER — Other Ambulatory Visit: Payer: Self-pay

## 2017-12-07 ENCOUNTER — Emergency Department (HOSPITAL_COMMUNITY): Payer: BC Managed Care – PPO

## 2017-12-07 ENCOUNTER — Encounter (HOSPITAL_COMMUNITY): Payer: Self-pay

## 2017-12-07 DIAGNOSIS — S93491A Sprain of other ligament of right ankle, initial encounter: Secondary | ICD-10-CM | POA: Diagnosis not present

## 2017-12-07 DIAGNOSIS — W19XXXA Unspecified fall, initial encounter: Secondary | ICD-10-CM

## 2017-12-07 DIAGNOSIS — W108XXA Fall (on) (from) other stairs and steps, initial encounter: Secondary | ICD-10-CM | POA: Insufficient documentation

## 2017-12-07 DIAGNOSIS — G35 Multiple sclerosis: Secondary | ICD-10-CM | POA: Diagnosis not present

## 2017-12-07 DIAGNOSIS — Z79899 Other long term (current) drug therapy: Secondary | ICD-10-CM | POA: Insufficient documentation

## 2017-12-07 DIAGNOSIS — S93401A Sprain of unspecified ligament of right ankle, initial encounter: Secondary | ICD-10-CM

## 2017-12-07 DIAGNOSIS — Y999 Unspecified external cause status: Secondary | ICD-10-CM | POA: Diagnosis not present

## 2017-12-07 DIAGNOSIS — S99911A Unspecified injury of right ankle, initial encounter: Secondary | ICD-10-CM | POA: Diagnosis present

## 2017-12-07 DIAGNOSIS — Y929 Unspecified place or not applicable: Secondary | ICD-10-CM | POA: Diagnosis not present

## 2017-12-07 DIAGNOSIS — J45909 Unspecified asthma, uncomplicated: Secondary | ICD-10-CM | POA: Diagnosis not present

## 2017-12-07 DIAGNOSIS — Z87891 Personal history of nicotine dependence: Secondary | ICD-10-CM | POA: Insufficient documentation

## 2017-12-07 DIAGNOSIS — Y9301 Activity, walking, marching and hiking: Secondary | ICD-10-CM | POA: Diagnosis not present

## 2017-12-07 MED ORDER — TRAMADOL HCL 50 MG PO TABS
50.0000 mg | ORAL_TABLET | Freq: Once | ORAL | Status: AC
  Administered 2017-12-07: 50 mg via ORAL

## 2017-12-07 NOTE — ED Notes (Signed)
Right ankle wrapped with ace bandage. Crutches given. Discharge and prescriptions reviewed. Discharge medication given before departure. Verbalized understanding.

## 2017-12-07 NOTE — ED Triage Notes (Signed)
Pt injured right ankle at 1930 on 9/11. Pt unable to find comfort.

## 2017-12-07 NOTE — ED Provider Notes (Signed)
[facility] EMERGENCY DEPARTMENT Provider Note   CSN: [id number] Arrival date & time: 12/07/17  0416     History   Chief Complaint Chief Complaint  Patient presents with  . Leg Pain    HPI Sheri Dickerson is a 47 y.o. female.  Patient presents with complaints of right ankle pain.  She states she was walking downstairs yesterday evening when she inverted and sprained her right ankle.  She reports pain and swelling since.  Her pain is worse with ambulation and there are no alleviating factors.  She denies other injury.     Past Medical History:  Diagnosis Date  . Asthma   . Dyslipidemia   . Genital herpes   . Multiple sclerosis (HCC) 04/22/2013    Patient Active Problem List   Diagnosis Date Noted  . Coccygeal pain 01/22/2016  . Multiple sclerosis (HCC) 04/22/2013    Past Surgical History:  Procedure Laterality Date  . ABDOMINAL HYSTERECTOMY    . BREAST FIBROADENOMA SURGERY       OB History   None      Home Medications    Prior to Admission medications   Medication Sig Start Date End Date Taking? Authorizing Provider  acetaminophen (TYLENOL) 325 MG tablet Take 650 mg by mouth as needed.    [provider]  busPIRone (BUSPAR) 10 MG tablet Take 10 mg by mouth 2 (two) times daily. 09/23/17   [provider]  cetirizine (ZYRTEC) 10 MG tablet Take 10 mg by mouth daily.    [provider]  escitalopram (LEXAPRO) 10 MG tablet Take 10 mg by mouth daily. 07/21/17   [provider]  fluticasone Aleda Grana) 50 MCG/ACT nasal spray  05/11/13   [provider]  hydrochlorothiazide (HYDRODIURIL) 25 MG tablet Take 25 mg by mouth daily. 07/21/17   [provider]  magnesium gluconate (MAGONATE) 500 MG tablet Take 500 mg by mouth 2 (two) times daily.    [provider]  meloxicam (MOBIC) 15 MG tablet Take 1 tablet (15 mg total) by mouth daily. 01/22/16   York Spaniel, MD  methocarbamol (ROBAXIN) 500 MG tablet Take  1 tablet (500 mg total) by mouth 3 (three) times daily. 10/02/17   Butch Penny, NP  Multiple Vitamins-Minerals (MULTIVITAMIN GUMMIES ADULT PO) Take by mouth.    [provider]  oxybutynin (DITROPAN-XL) 5 MG 24 hr tablet Take 1 tablet (5 mg total) by mouth at bedtime. 07/23/15   Butch Penny, NP  REBIF 44 MCG/0.5ML SOSY injection INJECT ONE SYRINGE (44 MCG) SUBCUTANEOUSLY THREE TIMES PER WEEK. KEEPREFRIGERATED. 09/14/17   York Spaniel, MD  valACYclovir (VALTREX) 500 MG tablet Take 500 mg by mouth daily.     [provider]    Family History Family History  Problem Relation Age of Onset  . Hypertension Mother   . Asthma Maternal Grandmother   . Cancer Maternal Grandfather        throat cancer    Social History Social History   Tobacco Use  . Smoking status: Former Games developer  . Smokeless tobacco: Never Used  Substance Use Topics  . Alcohol use: Yes    Comment: occasional  . Drug use: No     Allergies   Codeine and Prozac [fluoxetine hcl]   Review of Systems Review of Systems  All other systems reviewed and are negative.    Physical Exam Updated Vital Signs BP 111/88 (BP Location: Right Arm)   Pulse 92   Temp 97.9 F (36.6  C) (Oral)   Resp 18   Ht 5\' 3"  (1.6 m)   Wt 68 kg   SpO2 100%   BMI 26.56 kg/m   Physical Exam  Constitutional: She is oriented to person, place, and time. She appears well-developed and well-nourished. No distress.  HENT:  Head: Normocephalic and atraumatic.  Neck: Normal range of motion. Neck supple.  Pulmonary/Chest: Effort normal.  Musculoskeletal:  There is mild swelling of the soft tissues inferior to the lateral malleolus.  Ankle joint appears stable with no laxity.  There is no fifth metatarsal tenderness and no proximal fibular tenderness.  Neurological: She is alert and oriented to person, place, and time.  Skin: Skin is warm and dry. She is not diaphoretic.  Nursing note and vitals reviewed.    ED  Treatments / Results  Labs (all labs ordered are listed, but only abnormal results are displayed) Labs Reviewed - No data to display  EKG None  Radiology No results found.  Procedures Procedures (including critical care time)  Medications Ordered in ED Medications  traMADol (ULTRAM) tablet 50 mg (50 mg Oral Given 12/07/17 0230)     Initial Impression / Assessment and Plan / ED Course  I have reviewed the triage vital signs and the nursing notes.  Pertinent labs & imaging results that were available during my care of the patient were reviewed by me and considered in my medical decision making (see chart for details).  X-rays show a possible talar avulsion fracture, however otherwise are unremarkable.  This will be treated as a sprain with an Ace bandage, crutches, RICE, and prn follow up.    Final Clinical Impressions(s) / ED Diagnoses   Final diagnoses:  Fall, initial encounter    ED Discharge Orders    None       Geoffery Lyons, MD 12/07/17 [phone]

## 2018-01-09 ENCOUNTER — Telehealth: Payer: Self-pay | Admitting: Adult Health

## 2018-01-09 NOTE — Telephone Encounter (Signed)
Pt requesting a call would like to discuss scheduling an MRI. Stating that since August she has been experiencing blurred vision and always unbalanced( stating she is walking with a boot now due to a recent fall). Please call to advise

## 2018-01-10 ENCOUNTER — Other Ambulatory Visit: Payer: Self-pay

## 2018-01-10 NOTE — Telephone Encounter (Signed)
Yes- I can't complete an MRI until she is evaluated

## 2018-01-10 NOTE — Telephone Encounter (Signed)
Please schedule her for an appointment. I have some openings tomorrow

## 2018-01-10 NOTE — Telephone Encounter (Signed)
Spoke with Sheri Dickerson and she stated that her blurred vision happens when she doesn't wear her glasses, no headache associated with blurred vision. She also stated that she has a eye doctor appointment next month. She stated that she doesn't know when she would be able to come due her just starting back at her job. She mentioned that she wanted to get a MRI rule out anything serious. Patient has a pending appointment in Jan 2020. Should I add her to the waiting list?

## 2018-01-10 NOTE — Telephone Encounter (Signed)
Patient has been added to the wait list

## 2018-04-22 ENCOUNTER — Telehealth: Payer: Self-pay | Admitting: *Deleted

## 2018-04-22 NOTE — Telephone Encounter (Signed)
LVM informing patient that her FU in the morning needs to be rescheduled; the NP is sick. Advised she call back in 10-15 minutes or otherwise, please call the office in the morning to reschedule. Apologized and left office number.

## 2018-04-23 ENCOUNTER — Encounter

## 2018-04-23 ENCOUNTER — Ambulatory Visit: Payer: BC Managed Care – PPO | Admitting: Adult Health

## 2018-05-07 ENCOUNTER — Encounter: Payer: Self-pay | Admitting: Neurology

## 2018-05-28 ENCOUNTER — Ambulatory Visit: Payer: BC Managed Care – PPO | Admitting: Neurology

## 2018-06-21 ENCOUNTER — Telehealth: Payer: Self-pay

## 2018-06-21 NOTE — Telephone Encounter (Signed)
Patient would rather come in to the office to discuss her concerns in office. She has been rescheduled to 07/23/2018 at 10:15 am with Margie Ege, NP.

## 2018-06-22 ENCOUNTER — Ambulatory Visit: Payer: BC Managed Care – PPO | Admitting: Neurology

## 2018-07-18 ENCOUNTER — Telehealth: Payer: Self-pay

## 2018-07-18 NOTE — Telephone Encounter (Signed)
Patient has given verbal consent to do a webex and to file her insurance. E-mail has been confirmed and sent

## 2018-07-18 NOTE — Telephone Encounter (Signed)
Error

## 2018-07-23 ENCOUNTER — Other Ambulatory Visit: Payer: Self-pay

## 2018-07-23 ENCOUNTER — Encounter: Payer: Self-pay | Admitting: Neurology

## 2018-07-23 ENCOUNTER — Ambulatory Visit: Payer: Self-pay | Admitting: Neurology

## 2018-07-23 ENCOUNTER — Ambulatory Visit (INDEPENDENT_AMBULATORY_CARE_PROVIDER_SITE_OTHER): Payer: BC Managed Care – PPO | Admitting: Neurology

## 2018-07-23 DIAGNOSIS — G35 Multiple sclerosis: Secondary | ICD-10-CM | POA: Diagnosis not present

## 2018-07-23 MED ORDER — OXYBUTYNIN CHLORIDE ER 5 MG PO TB24: 5.0000 mg | ORAL_TABLET | Freq: Every day | ORAL | 5 refills | Status: DC

## 2018-07-23 NOTE — Progress Notes (Addendum)
Virtual Visit via Video Note  I connected with Maryuri A Rubiano on 07/23/18 at  3:15 PM EDT by a video enabled telemedicine application and verified that I am speaking with the correct person using two identifiers.   I discussed the limitations of evaluation and management by telemedicine and the availability of in person appointments. The patient expressed understanding and agreed to proceed.  History of Present Illness: 07/23/2018 SS: Ms. Durocher is a 48-year female with history of multiple sclerosis.  She is currently on Rebif injections, 3 times per week.  She reports continued numbness in her fingertips, toes.  She reports if she sits for a long time she feels stiff when she gets up, it harder to get going.  She thinks this may be due to the coronavirus pandemic, she is not able to work out as much.  She will occasionally complain of some sharp pains down her right leg when she is walking or she turns over the wrong way in bed.  She did report in December 2019 she had a fall.  She denies any new numbness or weakness.  She continues to complain of urinary urgency.  In the past she has been on oxybutynin, and reports this was beneficial.  She reports she is having to wear her glasses more often.  She reports she did have an eye exam recently and there was no change in her prescription.  She is currently taking Robaxin 500 mg, 3 times a day for muscle spasms.  She reports the spasms are most bothersome at bedtime, if she sitting on the toilet.  She reports in the last 6 months she has had 2 urinary tract infections, is taking over-the-counter Azo.  10/02/17: [name] is a 48 year old female with a history of multiple sclerosis.  She returns today for follow-up.  She remains on with this and is tolerating it well.  She reports that she occasionally has some numbness in the hands and feet.  This is not continuous.  She also reports back pain that includes the entire spine.  However she feels that it  more related to the paraspinal muscles.  She states that this pain is worse with activity or after sleeping.  She denies any significant changes with her gait or balance.  Reports on occasion she will stumble.  Denies any changes with her vision.  She does report that she is always cold.  She does have some incontinence of the bladder.  Reports that she was not taking oxybutynin but has started back this week.  She is currently in physical therapy through her primary care provider.  She returns today for evaluation   Observations/Objective: Alert, answers questions appropriately, follows commands, facial symmetry noted, symmetric eye movements, no arm drift, gait is intact  Assessment and Plan: 1.  Multiple sclerosis  I will restart her oxybutynin, for her urinary urgency.  She reports she is interested in possibly switching to another MS medication. The Rebif has been beneficial, but she reports she is tired of giving herself injections.  At times she may go a week without giving herself the injection.  She had MRI of the brain and cervical spine in January 2019, there was no change, there was a questionable lesion at C5, but appears to be chronic.  I will check blood work today.  She will continue taking Robaxin 500 mg 3 times a day for muscle spasms.  She reports the spasms are most bothersome at bedtime, in the future  we may increase her bedtime dose to 1000 mg. I will discuss with Dr. Anne Hahn regarding possibly switching MS medications.   Addendum 07/24/2018 SS: I talked with Dr. Anne Hahn, Ashok Cordia may be a good option for the patient. It is a once a day oral medication. She has had a hysterectomy, and liver function has been within normal limits. Switching to an oral will hopefully improve her compliance. I called the patient, and we discussed side effects. She will do her own research and let me know what she decides. If she decides to start aubagio will need to follow liver enzymes monthly for the first  6 months, then every 6 months. She is pending routine lab work, no need for further blood work before the switch.   Follow Up Instructions: 6 months    I discussed the assessment and treatment plan with the patient. The patient was provided an opportunity to ask questions and all were answered. The patient agreed with the plan and demonstrated an understanding of the instructions.   The patient was advised to call back or seek an in-person evaluation if the symptoms worsen or if the condition fails to improve as anticipated.  I provided 20 minutes of non-face-to-face time during this encounter.   Otila Kluver, DNP  [facility] [address], [address] [place], [place] [address] [phone]

## 2018-07-24 NOTE — Progress Notes (Signed)
I have read the note, and I agree with the clinical assessment and plan.  [place] Stell K Yanette Tripoli   

## 2018-08-02 ENCOUNTER — Other Ambulatory Visit: Payer: Self-pay

## 2018-08-02 ENCOUNTER — Other Ambulatory Visit (INDEPENDENT_AMBULATORY_CARE_PROVIDER_SITE_OTHER): Payer: Self-pay

## 2018-08-02 DIAGNOSIS — Z0289 Encounter for other administrative examinations: Secondary | ICD-10-CM

## 2018-08-02 DIAGNOSIS — G35 Multiple sclerosis: Secondary | ICD-10-CM

## 2018-08-03 LAB — COMPREHENSIVE METABOLIC PANEL
ALT: 17 IU/L (ref 0–32)
AST: 25 IU/L (ref 0–40)
Albumin/Globulin Ratio: 1.9 (ref 1.2–2.2)
Albumin: 4.9 g/dL — ABNORMAL HIGH (ref 3.8–4.8)
Alkaline Phosphatase: 92 IU/L (ref 39–117)
BUN/Creatinine Ratio: 14 (ref 9–23)
BUN: 12 mg/dL (ref 6–24)
Bilirubin Total: <0.2 mg/dL (ref 0.0–1.2)
CO2: 26 mmol/L (ref 20–29)
Calcium: 10.4 mg/dL — ABNORMAL HIGH (ref 8.7–10.2)
Chloride: 102 mmol/L (ref 96–106)
Creatinine, Ser: 0.87 mg/dL (ref 0.57–1.00)
GFR calc Af Amer: 91 mL/min/{1.73_m2} (ref >59)
GFR calc non Af Amer: 79 mL/min/{1.73_m2} (ref >59)
Globulin, Total: 2.6 g/dL (ref 1.5–4.5)
Glucose: 97 mg/dL (ref 65–99)
Potassium: 3.9 mmol/L (ref 3.5–5.2)
Sodium: 143 mmol/L (ref 134–144)
Total Protein: 7.5 g/dL (ref 6.0–8.5)

## 2018-08-03 LAB — CBC WITH DIFFERENTIAL/PLATELET
Basophils Absolute: 0 10*3/uL (ref 0.0–0.2)
Basos: 0 %
EOS (ABSOLUTE): 0.1 10*3/uL (ref 0.0–0.4)
Eos: 1 %
Hematocrit: 36 % (ref 34.0–46.6)
Hemoglobin: 12 g/dL (ref 11.1–15.9)
Immature Grans (Abs): 0 10*3/uL (ref 0.0–0.1)
Immature Granulocytes: 0 %
Lymphocytes Absolute: 3.5 10*3/uL — ABNORMAL HIGH (ref 0.7–3.1)
Lymphs: 36 %
MCH: 31.3 pg (ref 26.6–33.0)
MCHC: 33.3 g/dL (ref 31.5–35.7)
MCV: 94 fL (ref 79–97)
Monocytes Absolute: 0.4 10*3/uL (ref 0.1–0.9)
Monocytes: 5 %
Neutrophils Absolute: 5.6 10*3/uL (ref 1.4–7.0)
Neutrophils: 58 %
Platelets: 237 10*3/uL (ref 150–450)
RBC: 3.84 x10E6/uL (ref 3.77–5.28)
RDW: 13.5 % (ref 11.7–15.4)
WBC: 9.7 10*3/uL (ref 3.4–10.8)

## 2018-08-07 ENCOUNTER — Other Ambulatory Visit: Payer: Self-pay | Admitting: *Deleted

## 2018-08-07 MED ORDER — OXYBUTYNIN CHLORIDE ER 5 MG PO TB24: 5.0000 mg | ORAL_TABLET | Freq: Every day | ORAL | 5 refills | Status: DC

## 2018-08-07 NOTE — Telephone Encounter (Signed)
-----   Message from Glean Salvo, NP sent at 08/06/2018 10:16 AM EDT ----- Please call the patient and let her know that her lab work was unremarkable with the exception of mildly increased absolute lymphocyte count. This could be elevated as result of mild illness. If she is feeling poorly, monitor her symptoms, report to primary care if needed. At our last visit, she was going to consider switching to another MS medication.

## 2018-08-07 NOTE — Telephone Encounter (Signed)
Relayed to pt her labs results to her as :   called the patient and let her know that her lab work was unremarkable with the exception of mildly increased absolute lymphocyte count. This could be elevated as result of mild illness. If she is feeling poorly, monitor her symptoms, report to primary care if needed. At our last visit, she was going to consider switching to another MS medication.  She stated she has had issues with UTI, and being treated by her pcp.  Ditropan she is trying to get filled, (this was resent her her local [facility] in [place], [place]).  She was appreciated.

## 2018-08-09 ENCOUNTER — Telehealth: Payer: Self-pay | Admitting: *Deleted

## 2018-08-09 NOTE — Telephone Encounter (Signed)
LMVM for pt to return call to schedule follow up appointment. When returns call please make appointment.  6mo.  

## 2018-10-03 ENCOUNTER — Telehealth: Payer: Self-pay | Admitting: Neurology

## 2018-10-03 MED ORDER — REBIF 44 MCG/0.5ML ~~LOC~~ SOSY: PREFILLED_SYRINGE | SUBCUTANEOUS | 3 refills | Status: DC

## 2018-10-03 NOTE — Telephone Encounter (Signed)
Patient needs a refill on her Rebif.

## 2018-10-03 NOTE — Addendum Note (Signed)
Addended by: Brandon Melnick on: 10/03/2018 02:39 PM   Modules accepted: Orders

## 2018-10-03 NOTE — Telephone Encounter (Signed)
Called pt and she would like to continue with rebif at this time due to covid 19.  Will refill.  Made RV 55mo with SS/NP 01-22-19 at 1115.

## 2018-12-22 ENCOUNTER — Other Ambulatory Visit: Payer: Self-pay | Admitting: Neurology

## 2019-01-22 ENCOUNTER — Ambulatory Visit: Payer: Self-pay | Admitting: Neurology

## 2019-02-12 NOTE — Progress Notes (Signed)
PATIENT: Sheri Dickerson DOB: 1970/05/18  REASON FOR VISIT: follow up HISTORY FROM: patient  HISTORY OF PRESENT ILLNESS: Today 02/13/19  Ms. Sheri Dickerson is a 48 year old female with history of multiple sclerosis.  She remains on Rebif injection.  She last had MRI of the brain and cervical spine in January 2019, did not show any change, there was a questionable lesion at the C5, but appears chronic.  At her last visit, we discussed switching to oral medications for MS, but she decided to continue on her current therapy.  She has become tired of taking Rebif injections, and admits to sometimes skipping doses.  She is no longer taking Robaxin or oxybutynin consistently because she has not gone back to work.  She denies any new numbness or weakness.  She does complain of tingling and numbness in her fingertips.  She has not had any recent falls.  She reports over time her vision has gradually declined.  She does notice in the mornings, the strength in her hands may be weaker.  She denies any changes to her bowels or bladder.  She has close follow-up with GI for issues, unable to determine exact etiology, thought to have significant acid reflux.  She does have urinary urgency.  At times her balance may be off, and she may have a near stumble.  She has started to work out 30 minutes daily at her home.  She has been very careful to practice social distancing.  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  She does live in [place].  She presents today for follow-up unaccompanied.  HISTORY 07/23/2018 SS: Ms. Sheri Dickerson is a 47-year female with history of multiple sclerosis.  She is currently on Rebif injections, 3 times per week.  She reports continued numbness in her fingertips, toes.  She reports if she sits for a long time she feels stiff when she gets up, it harder to get going.  She thinks this may be due to the coronavirus pandemic, she is not able to  work out as much.  She will occasionally complain of some sharp pains down her right leg when she is walking or she turns over the wrong way in bed.  She did report in December 2019 she had a fall.  She denies any new numbness or weakness.  She continues to complain of urinary urgency.  In the past she has been on oxybutynin, and reports this was beneficial.  She reports she is having to wear her glasses more often.  She reports she did have an eye exam recently and there was no change in her prescription.  She is currently taking Robaxin 500 mg, 3 times a day for muscle spasms.  She reports the spasms are most bothersome at bedtime, if she sitting on the toilet.  She reports in the last 6 months she has had 2 urinary tract infections, is taking over-the-counter Azo.  REVIEW OF SYSTEMS: Out of a complete 14 system review of symptoms, the patient complains only of the following symptoms, and all other reviewed systems are negative.  Tingling  ALLERGIES: Allergies  Allergen Reactions  . Codeine   . Prozac [Fluoxetine Hcl]     Hives    HOME MEDICATIONS: Outpatient Medications Prior to Visit  Medication Sig Dispense Refill  . acetaminophen (TYLENOL) 325 MG tablet Take 650 mg by mouth as needed.    . cetirizine (ZYRTEC) 10 MG tablet Take  10 mg by mouth daily.    . famotidine (PEPCID) 20 MG tablet Take 20 mg by mouth daily.    . fluticasone (FLONASE) 50 MCG/ACT nasal spray     . hydrochlorothiazide (HYDRODIURIL) 25 MG tablet Take 25 mg by mouth daily.  2  . lansoprazole (PREVACID) 30 MG capsule Take 30 mg by mouth daily.    . magnesium gluconate (MAGONATE) 500 MG tablet Take 500 mg by mouth 2 (two) times daily.    . Multiple Vitamins-Minerals (MULTIVITAMIN GUMMIES ADULT PO) Take by mouth.    . Omega-3 Fatty Acids (OMEGA 3 PO) Take 1 tablet by mouth daily.    Marland Kitchen oxybutynin (DITROPAN-XL) 5 MG 24 hr tablet TAKE 1 TABLET BY MOUTH EVERYDAY AT BEDTIME 90 tablet 1  . sucralfate (CARAFATE) 1 g tablet  Take 1 g by mouth as needed.    . valACYclovir (VALTREX) 500 MG tablet Take 500 mg by mouth daily.     Marland Kitchen VITAMIN D PO Take 1 tablet by mouth daily.    Marland Kitchen VITAMIN E PO Take 1 tablet by mouth daily.    . interferon beta-1a (REBIF) 44 MCG/0.5ML SOSY injection INJECT ONE SYRINGE (44 MCG) SUBCUTANEOUSLY THREE TIMES PER WEEK. KEEPREFRIGERATED. 36 mL 3  . busPIRone (BUSPAR) 10 MG tablet Take 10 mg by mouth 2 (two) times daily.  3  . escitalopram (LEXAPRO) 10 MG tablet Take 10 mg by mouth daily.  2  . meloxicam (MOBIC) 15 MG tablet Take 1 tablet (15 mg total) by mouth daily. (Patient not taking: Reported on 07/23/2018) 30 tablet 1  . methocarbamol (ROBAXIN) 500 MG tablet Take 1 tablet (500 mg total) by mouth 3 (three) times daily. 90 tablet 3   No facility-administered medications prior to visit.     PAST MEDICAL HISTORY: Past Medical History:  Diagnosis Date  . Asthma   . Dyslipidemia   . Genital herpes   . Multiple sclerosis ([place]) 04/22/2013    PAST SURGICAL HISTORY: Past Surgical History:  Procedure Laterality Date  . ABDOMINAL HYSTERECTOMY    . BREAST FIBROADENOMA SURGERY      FAMILY HISTORY: Family History  Problem Relation Age of Onset  . Hypertension Mother   . Asthma Maternal Grandmother   . Cancer Maternal Grandfather        throat cancer    SOCIAL HISTORY: Social History   Socioeconomic History  . Marital status: Single    Spouse name: Not on file  . Number of children: 0  . Years of education: college  . Highest education level: Not on file  Occupational History  . Occupation: Pharmacist, hospital    Comment: [employer]  . Financial resource strain: Not on file  . Food insecurity    Worry: Not on file    Inability: Not on file  . Transportation needs    Medical: Not on file    Non-medical: Not on file  Tobacco Use  . Smoking status: Former Research scientist (life sciences)  . Smokeless tobacco: Never Used  Substance and Sexual Activity  . Alcohol use: Yes    Comment:  occasional  . Drug use: No  . Sexual activity: Not on file  Lifestyle  . Physical activity    Days per week: Not on file    Minutes per session: Not on file  . Stress: Not on file  Relationships  . Social Herbalist on phone: Not on file    Gets together: Not on file  Attends religious service: Not on file    Active member of club or organization: Not on file    Attends meetings of clubs or organizations: Not on file    Relationship status: Not on file  . Intimate partner violence    Fear of current or ex partner: Not on file    Emotionally abused: Not on file    Physically abused: Not on file    Forced sexual activity: Not on file  Other Topics Concern  . Not on file  Social History Narrative   Lives at home w/ a friend   Right-handed   Occasional caffeine (~ once per week)    PHYSICAL EXAM  Vitals:   02/13/19 0821  BP: 125/89  Pulse: 88  Temp: (!) 97.4 F (36.3 C)  TempSrc: Oral  Weight: 167 lb 9.6 oz (76 kg)  Height: 5\' 3"  (1.6 m)   Body mass index is 29.69 kg/m.  Generalized: Well developed, in no acute distress   Neurological examination  Mentation: Alert oriented to time, place, history taking. Follows all commands speech and language fluent Cranial nerve II-XII: Pupils were equal round reactive to light. Extraocular movements were full, visual field were full on confrontational test. Facial sensation and strength were normal.  Head turning and shoulder shrug  were normal and symmetric. Motor: The motor testing reveals 5 over 5 strength of all 4 extremities. Good symmetric motor tone is noted throughout.  Sensory: Sensory testing is intact to soft touch on all 4 extremities. No evidence of extinction is noted.  Coordination: Cerebellar testing reveals good finger-nose-finger and heel-to-shin bilaterally.  Gait and station: Gait is normal. Tandem gait is mildly unsteady. Romberg is negative. No drift is seen.  Reflexes: Deep tendon reflexes are  symmetric and normal bilaterally.   DIAGNOSTIC DATA (LABS, IMAGING, TESTING) - I reviewed patient records, labs, notes, testing and imaging myself where available.  Lab Results  Component Value Date   WBC 9.7 08/02/2018   HGB 12.0 08/02/2018   HCT 36.0 08/02/2018   MCV 94 08/02/2018   PLT 237 08/02/2018      Component Value Date/Time   NA 143 08/02/2018 1611   K 3.9 08/02/2018 1611   CL 102 08/02/2018 1611   CO2 26 08/02/2018 1611   GLUCOSE 97 08/02/2018 1611   BUN 12 08/02/2018 1611   CREATININE 0.87 08/02/2018 1611   CALCIUM 10.4 (H) 08/02/2018 1611   PROT 7.5 08/02/2018 1611   ALBUMIN 4.9 (H) 08/02/2018 1611   AST 25 08/02/2018 1611   ALT 17 08/02/2018 1611   ALKPHOS 92 08/02/2018 1611   BILITOT <0.2 08/02/2018 1611   GFRNONAA 79 08/02/2018 1611   GFRAA 91 08/02/2018 1611   No results found for: CHOL, HDL, LDLCALC, LDLDIRECT, TRIG, CHOLHDL No results found for: ZOXW9UHGBA1C No results found for: VITAMINB12 No results found for: TSH  ASSESSMENT AND PLAN 48 y.o. year old female  has a past medical history of Asthma, Dyslipidemia, Genital herpes, and Multiple sclerosis ([facility]) (04/22/2013). here with:  1.  Multiple sclerosis  She has continued to do well.  We have discussed switching to an oral medication such as Aubagio, to improve her compliance.  For now, she will remain on Rebif, as she is hesitant to make a switch during the pandemic.  We can consider MRI of the brain and cervical spine at next visit.  I will check routine lab work today. 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  She will follow-up in 6 months or sooner if needed.  I did advise if her symptoms worsen or she develops new symptoms she should let us know.  I spent 15 minutes with the patient. 50% of this time was spent discussing her plan of care.  Margie EgeSarah Slack, AGNP-C, DNP 02/13/2019, 11:23 AM [facility] [address], [address] [place], [place] [address] [phone]

## 2019-02-13 ENCOUNTER — Encounter: Payer: Self-pay | Admitting: Neurology

## 2019-02-13 ENCOUNTER — Ambulatory Visit: Payer: BC Managed Care – PPO | Admitting: Neurology

## 2019-02-13 ENCOUNTER — Other Ambulatory Visit: Payer: Self-pay

## 2019-02-13 VITALS — BP 125/89 | HR 88 | Temp 97.4°F | Ht 63.0 in | Wt 167.6 lb

## 2019-02-13 DIAGNOSIS — G35 Multiple sclerosis: Secondary | ICD-10-CM

## 2019-02-13 MED ORDER — REBIF 44 MCG/0.5ML ~~LOC~~ SOSY: PREFILLED_SYRINGE | SUBCUTANEOUS | 3 refills | Status: DC

## 2019-02-13 NOTE — Patient Instructions (Addendum)
I will check lab work today. We will get MRI at next visit. Continue current medications.

## 2019-02-14 ENCOUNTER — Telehealth: Payer: Self-pay | Admitting: *Deleted

## 2019-02-14 LAB — CBC WITH DIFFERENTIAL/PLATELET
Basophils Absolute: 0 10*3/uL (ref 0.0–0.2)
Basos: 1 %
EOS (ABSOLUTE): 0.1 10*3/uL (ref 0.0–0.4)
Eos: 1 %
Hematocrit: 37.3 % (ref 34.0–46.6)
Hemoglobin: 12.6 g/dL (ref 11.1–15.9)
Immature Grans (Abs): 0 10*3/uL (ref 0.0–0.1)
Immature Granulocytes: 0 %
Lymphocytes Absolute: 2.4 10*3/uL (ref 0.7–3.1)
Lymphs: 29 %
MCH: 31.2 pg (ref 26.6–33.0)
MCHC: 33.8 g/dL (ref 31.5–35.7)
MCV: 92 fL (ref 79–97)
Monocytes Absolute: 0.5 10*3/uL (ref 0.1–0.9)
Monocytes: 7 %
Neutrophils Absolute: 5.3 10*3/uL (ref 1.4–7.0)
Neutrophils: 62 %
Platelets: 249 10*3/uL (ref 150–450)
RBC: 4.04 x10E6/uL (ref 3.77–5.28)
RDW: 13.2 % (ref 11.7–15.4)
WBC: 8.3 10*3/uL (ref 3.4–10.8)

## 2019-02-14 LAB — COMPREHENSIVE METABOLIC PANEL
ALT: 16 IU/L (ref 0–32)
AST: 23 IU/L (ref 0–40)
Albumin/Globulin Ratio: 1.7 (ref 1.2–2.2)
Albumin: 4.7 g/dL (ref 3.8–4.8)
Alkaline Phosphatase: 117 IU/L (ref 39–117)
BUN/Creatinine Ratio: 21 (ref 9–23)
BUN: 16 mg/dL (ref 6–24)
Bilirubin Total: 0.2 mg/dL (ref 0.0–1.2)
CO2: 27 mmol/L (ref 20–29)
Calcium: 10 mg/dL (ref 8.7–10.2)
Chloride: 99 mmol/L (ref 96–106)
Creatinine, Ser: 0.77 mg/dL (ref 0.57–1.00)
GFR calc Af Amer: 106 mL/min/{1.73_m2} (ref >59)
GFR calc non Af Amer: 92 mL/min/{1.73_m2} (ref >59)
Globulin, Total: 2.8 g/dL (ref 1.5–4.5)
Glucose: 94 mg/dL (ref 65–99)
Potassium: 4.3 mmol/L (ref 3.5–5.2)
Sodium: 143 mmol/L (ref 134–144)
Total Protein: 7.5 g/dL (ref 6.0–8.5)

## 2019-02-14 NOTE — Progress Notes (Signed)
I have read the note, and I agree with the clinical assessment and plan.  Melody Cirrincione K Micha Erck   

## 2019-02-14 NOTE — Telephone Encounter (Signed)
-----   Message from Suzzanne Cloud, NP sent at 02/14/2019  8:09 AM EST ----- Please call the patient. Labs look good are normal :)

## 2019-02-14 NOTE — Telephone Encounter (Signed)
Spoke to pt and relayed that the labs normal per SS/NP.  Pt verbalized understanding.

## 2019-08-12 NOTE — Progress Notes (Signed)
PATIENT: Sheri Dickerson DOB: 12/03/1970  REASON FOR VISIT: follow up HISTORY FROM: patient  HISTORY OF PRESENT ILLNESS: Today 08/13/19  Sheri Dickerson is a 49 year old female with history of multiple sclerosis.  She remains on Rebif.  We have previously discussed switching to oral medications for MS, but she has decided to stay on her current therapy, fearful of switching during pandemic, also personal experience of family members who haven't done well on oral medications. She hasn't been consistent about taking Rebif 3 times a week, usually takes twice.  She is growing tired of injections.  She has not been on oxybutynin, thought it would cause fluid retention.  She continues to report urinary frequency, urgency, leaking.  She has to wear pads.  Denies any new numbness or weakness, does report feels that the strength in her right hand is declining, could be to overuse, has been doing a lot of baking, relying on the right hand.  She has some balance instability, has not fallen, but is very careful.  Will be seeing PCP tomorrow, going to discuss medication for anxiety, has a lot of stress going on, feeling overwhelmed.  She took this year off work from school, and is apprehensive about going back in August due to the pandemic.  She has been vaccinated with [employer] vaccine.  She presents today for evaluation unaccompanied.  HISTORY 02/13/2019 SS: Sheri Dickerson is a 49 year old female with history of multiple sclerosis.  She remains on Rebif injection.  She last had MRI of the brain and cervical spine in January 2019, did not show any change, there was a questionable lesion at the C5, but appears chronic.  At her last visit, we discussed switching to oral medications for MS, but she decided to continue on her current therapy.  She has become tired of taking Rebif injections, and admits to sometimes skipping doses.  She is no longer taking Robaxin or oxybutynin consistently because she has not gone back to  work.  She denies any new numbness or weakness.  She does complain of tingling and numbness in her fingertips.  She has not had any recent falls.  She reports over time her vision has gradually declined.  She does notice in the mornings, the strength in her hands may be weaker.  She denies any changes to her bowels or bladder.  She has close follow-up with GI for issues, unable to determine exact etiology, thought to have significant acid reflux.  She does have urinary urgency.  At times her balance may be off, and she may have a near stumble.  She has started to work out 30 minutes daily at her home.  She has been very careful to practice social distancing.  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  She does live in [place].  She presents today for follow-up unaccompanied.   REVIEW OF SYSTEMS: Out of a complete 14 system review of symptoms, the patient complains only of the following symptoms, and all other reviewed systems are negative.  Weakness  ALLERGIES: Allergies  Allergen Reactions  . Codeine   . Prozac [Fluoxetine Hcl]     Hives    HOME MEDICATIONS: Outpatient Medications Prior to Visit  Medication Sig Dispense Refill  . acetaminophen (TYLENOL) 325 MG tablet Take 650 mg by mouth as needed.    Marland Kitchen b complex vitamins tablet Take 1 tablet by mouth daily.    Marland Kitchen  cetirizine (ZYRTEC) 10 MG tablet Take 10 mg by mouth daily.    . fluticasone (FLONASE) 50 MCG/ACT nasal spray     . hydrochlorothiazide (HYDRODIURIL) 25 MG tablet Take 25 mg by mouth daily.  2  . lansoprazole (PREVACID) 30 MG capsule Take 30 mg by mouth daily.    . magnesium gluconate (MAGONATE) 500 MG tablet Take 500 mg by mouth 2 (two) times daily.    . Multiple Vitamins-Minerals (MULTIVITAMIN GUMMIES ADULT PO) Take by mouth.    . Omega-3 Fatty Acids (OMEGA 3 PO) Take 1 tablet by mouth daily.    . Potassium 75 MG TABS Take by mouth.    . sucralfate (CARAFATE) 1 g  tablet Take 1 g by mouth as needed.    . valACYclovir (VALTREX) 500 MG tablet Take 500 mg by mouth daily.     Marland Kitchen VITAMIN D PO Take 1 tablet by mouth daily.    Marland Kitchen VITAMIN E PO Take 1 tablet by mouth daily.    . famotidine (PEPCID) 20 MG tablet Take 20 mg by mouth daily.    . interferon beta-1a (REBIF) 44 MCG/0.5ML SOSY injection INJECT ONE SYRINGE (44 MCG) SUBCUTANEOUSLY THREE TIMES PER WEEK. KEEPREFRIGERATED. 36 mL 3  . oxybutynin (DITROPAN-XL) 5 MG 24 hr tablet TAKE 1 TABLET BY MOUTH EVERYDAY AT BEDTIME 90 tablet 1   No facility-administered medications prior to visit.    PAST MEDICAL HISTORY: Past Medical History:  Diagnosis Date  . Asthma   . Dyslipidemia   . Genital herpes   . Multiple sclerosis ([place]) 04/22/2013    PAST SURGICAL HISTORY: Past Surgical History:  Procedure Laterality Date  . ABDOMINAL HYSTERECTOMY    . BREAST FIBROADENOMA SURGERY      FAMILY HISTORY: Family History  Problem Relation Age of Onset  . Hypertension Mother   . Asthma Maternal Grandmother   . Cancer Maternal Grandfather        throat cancer    SOCIAL HISTORY: Social History   Socioeconomic History  . Marital status: Single    Spouse name: Not on file  . Number of children: 0  . Years of education: college  . Highest education level: Not on file  Occupational History  . Occupation: Pharmacist, hospital    Comment: Cytogeneticist  Tobacco Use  . Smoking status: Former Research scientist (life sciences)  . Smokeless tobacco: Never Used  Substance and Sexual Activity  . Alcohol use: Yes    Comment: occasional  . Drug use: No  . Sexual activity: Not on file  Other Topics Concern  . Not on file  Social History Narrative   Lives at home w/ a friend   Right-handed   Occasional caffeine (~ once per week)   Social Determinants of Health   Financial Resource Strain:   . Difficulty of Paying Living Expenses:   Food Insecurity:   . Worried About Charity fundraiser in the Last Year:   . Arboriculturist in the Last  Year:   Transportation Needs:   . Film/video editor (Medical):   Marland Kitchen Lack of Transportation (Non-Medical):   Physical Activity:   . Days of Exercise per Week:   . Minutes of Exercise per Session:   Stress:   . Feeling of Stress :   Social Connections:   . Frequency of Communication with Friends and Family:   . Frequency of Social Gatherings with Friends and Family:   . Attends Religious Services:   . Active Member of Clubs or Organizations:   .  Attends Banker Meetings:   Marland Kitchen Marital Status:   Intimate Partner Violence:   . Fear of Current or Ex-Partner:   . Emotionally Abused:   Marland Kitchen Physically Abused:   . Sexually Abused:    PHYSICAL EXAM  Vitals:   08/13/19 1236  BP: 131/90  Pulse: 88  Weight: 168 lb (76.2 kg)  Height: 5\' 3"  (1.6 m)   Body mass index is 29.76 kg/m.  Generalized: Well developed, in no acute distress   Neurological examination  Mentation: Alert oriented to time, place, history taking. Follows all commands speech and language fluent Cranial nerve II-XII: Pupils were equal round reactive to light. Extraocular movements were full, visual field were full on confrontational test. Facial sensation and strength were normal. Head turning and shoulder shrug  were normal and symmetric. Motor: The motor testing reveals 5 over 5 strength of all 4 extremities. Good symmetric motor tone is noted throughout.  Equal grip strength bilaterally. Sensory: Sensory testing is intact to soft touch on all 4 extremities. No evidence of extinction is noted.  Coordination: Cerebellar testing reveals good finger-nose-finger and heel-to-shin bilaterally.  Gait and station: Gait is normal. Tandem gait is normal. Romberg is negative. No drift is seen.  Reflexes: Deep tendon reflexes are symmetric and normal bilaterally.   DIAGNOSTIC DATA (LABS, IMAGING, TESTING) - I reviewed patient records, labs, notes, testing and imaging myself where available.  Lab Results    Component Value Date   WBC 8.3 02/13/2019   HGB 12.6 02/13/2019   HCT 37.3 02/13/2019   MCV 92 02/13/2019   PLT 249 02/13/2019      Component Value Date/Time   NA 143 02/13/2019 0854   K 4.3 02/13/2019 0854   CL 99 02/13/2019 0854   CO2 27 02/13/2019 0854   GLUCOSE 94 02/13/2019 0854   BUN 16 02/13/2019 0854   CREATININE 0.77 02/13/2019 0854   CALCIUM 10.0 02/13/2019 0854   PROT 7.5 02/13/2019 0854   ALBUMIN 4.7 02/13/2019 0854   AST 23 02/13/2019 0854   ALT 16 02/13/2019 0854   ALKPHOS 117 02/13/2019 0854   BILITOT 0.2 02/13/2019 0854   GFRNONAA 92 02/13/2019 0854   GFRAA 106 02/13/2019 0854   No results found for: CHOL, HDL, LDLCALC, LDLDIRECT, TRIG, CHOLHDL No results found for: 02/15/2019 No results found for: VITAMINB12 No results found for: TSH  ASSESSMENT AND PLAN 49 y.o. year old female  has a past medical history of Asthma, Dyslipidemia, Genital herpes, and Multiple sclerosis (HCC) (04/22/2013). here with:  1.  Relapsing remitting multiple sclerosis  Overall, she has remained stable.  We have previously discussed switching to oral medications, she has chosen to remain on Rebif, however her compliance is not great.  We will check MRI of the brain and cervical spine with and without contrast.  If any change on MRI, we will likely consider change in medication.  For now, she wishes to remain on rebif, we have discussed the importance of doing the injections 3 times a week.  She will restart oxybutynin for urinary issues.  I will check routine lab work today.  She will follow-up in 6 months or sooner if needed.   I spent 30 minutes of face-to-face and non-face-to-face time with patient.  This included previsit chart review, lab review, study review, order entry, electronic health record documentation, patient education.   04/24/2013, AGNP-C, DNP 08/13/2019, 1:22 PM [facility] [address] [place], [place] [phone]

## 2019-08-13 ENCOUNTER — Other Ambulatory Visit: Payer: Self-pay

## 2019-08-13 ENCOUNTER — Encounter: Payer: Self-pay | Admitting: Neurology

## 2019-08-13 ENCOUNTER — Ambulatory Visit: Payer: BC Managed Care – PPO | Admitting: Neurology

## 2019-08-13 VITALS — BP 131/90 | HR 88 | Ht 63.0 in | Wt 168.0 lb

## 2019-08-13 DIAGNOSIS — G35 Multiple sclerosis: Secondary | ICD-10-CM

## 2019-08-13 MED ORDER — REBIF 44 MCG/0.5ML ~~LOC~~ SOSY: PREFILLED_SYRINGE | SUBCUTANEOUS | 3 refills | Status: DC

## 2019-08-13 MED ORDER — OXYBUTYNIN CHLORIDE ER 5 MG PO TB24: ORAL_TABLET | ORAL | 1 refills | Status: DC

## 2019-08-13 NOTE — Patient Instructions (Signed)
It was nice to see you today Check blood work today  Check MRI of the brain, cervical spine Continue current medications See you back in 6 months

## 2019-08-14 LAB — CBC WITH DIFFERENTIAL/PLATELET
Basophils Absolute: 0 10*3/uL (ref 0.0–0.2)
Basos: 0 %
EOS (ABSOLUTE): 0.2 10*3/uL (ref 0.0–0.4)
Eos: 2 %
Hematocrit: 35.9 % (ref 34.0–46.6)
Hemoglobin: 12 g/dL (ref 11.1–15.9)
Immature Grans (Abs): 0 10*3/uL (ref 0.0–0.1)
Immature Granulocytes: 0 %
Lymphocytes Absolute: 1.4 10*3/uL (ref 0.7–3.1)
Lymphs: 16 %
MCH: 31.5 pg (ref 26.6–33.0)
MCHC: 33.4 g/dL (ref 31.5–35.7)
MCV: 94 fL (ref 79–97)
Monocytes Absolute: 0.7 10*3/uL (ref 0.1–0.9)
Monocytes: 8 %
Neutrophils Absolute: 6.7 10*3/uL (ref 1.4–7.0)
Neutrophils: 74 %
Platelets: 279 10*3/uL (ref 150–450)
RBC: 3.81 x10E6/uL (ref 3.77–5.28)
RDW: 13.2 % (ref 11.7–15.4)
WBC: 9.1 10*3/uL (ref 3.4–10.8)

## 2019-08-14 LAB — COMPREHENSIVE METABOLIC PANEL
ALT: 17 IU/L (ref 0–32)
AST: 20 IU/L (ref 0–40)
Albumin/Globulin Ratio: 1.7 (ref 1.2–2.2)
Albumin: 4.5 g/dL (ref 3.8–4.8)
Alkaline Phosphatase: 111 IU/L (ref 48–121)
BUN/Creatinine Ratio: 16 (ref 9–23)
BUN: 13 mg/dL (ref 6–24)
Bilirubin Total: 0.2 mg/dL (ref 0.0–1.2)
CO2: 26 mmol/L (ref 20–29)
Calcium: 9.7 mg/dL (ref 8.7–10.2)
Chloride: 98 mmol/L (ref 96–106)
Creatinine, Ser: 0.81 mg/dL (ref 0.57–1.00)
GFR calc Af Amer: 99 mL/min/{1.73_m2} (ref >59)
GFR calc non Af Amer: 86 mL/min/{1.73_m2} (ref >59)
Globulin, Total: 2.6 g/dL (ref 1.5–4.5)
Glucose: 91 mg/dL (ref 65–99)
Potassium: 4.1 mmol/L (ref 3.5–5.2)
Sodium: 141 mmol/L (ref 134–144)
Total Protein: 7.1 g/dL (ref 6.0–8.5)

## 2019-08-15 ENCOUNTER — Telehealth: Payer: Self-pay

## 2019-08-15 NOTE — Telephone Encounter (Addendum)
Margarette A Boxwell is a 49 y.o. female was notified of the message below.. She was happy and had no additional questions  ----- Message from Guy Begin, RN sent at 08/14/2019  7:49 AM EDT -----  ----- Message ----- From: Glean Salvo, NP Sent: 08/14/2019   6:04 AM EDT To: Guy Begin, RN  Labs look good. Kidney function is good for MRI with contrast.

## 2019-08-15 NOTE — Progress Notes (Signed)
I have read the note, and I agree with the clinical assessment and plan.  Edi Gorniak K Amariyah Bazar   

## 2019-08-21 ENCOUNTER — Telehealth: Payer: Self-pay | Admitting: Neurology

## 2019-08-21 NOTE — Telephone Encounter (Signed)
LVM for pt to call back i need her insurance info. If she calls back please ask her for her insurance info thank you!!

## 2019-08-29 NOTE — Telephone Encounter (Signed)
Pt has called to report that she will call at a later time to pursue her MRI due to insurance.

## 2019-08-29 NOTE — Telephone Encounter (Signed)
Noted, thank you

## 2019-10-23 ENCOUNTER — Telehealth: Payer: Self-pay

## 2019-10-23 MED ORDER — REBIF 44 MCG/0.5ML ~~LOC~~ SOSY: PREFILLED_SYRINGE | SUBCUTANEOUS | 3 refills | Status: DC

## 2019-10-23 NOTE — Telephone Encounter (Signed)
Sent to [facility] for [employer].

## 2019-10-23 NOTE — Telephone Encounter (Signed)
Patient is out of her rebif and needs a new Rx sent to [facility] asap. She was getting it through [facility] but found out that they are out of network with her new insurance.

## 2019-10-23 NOTE — Addendum Note (Signed)
Addended by: Guy Begin on: 10/23/2019 04:29 PM   Modules accepted: Orders

## 2019-10-24 NOTE — Telephone Encounter (Signed)
[facility]ed [facility] spoke to [place], urgent PA done on REBIF for pt.  58ml/30 day. Been on rebif since we started seeing her since 2012.  I do not have information prior to that.  Will go to clinical review.  CASE PA # [id number].  Determination pending.

## 2019-10-24 NOTE — Telephone Encounter (Signed)
[facility] is asking for a call re: an urgent need for a PA for pt's interferon beta-1a (REBIF) 44 MCG/0.5ML SOSY injection.   [facility] rep states the provider line to call is [phone]

## 2019-10-31 NOTE — Telephone Encounter (Signed)
[facility] has called stating pt's plan [employer]) is asking the medical records for pt's Rebiff be faxed to them at [phone]  Attention Plan Review

## 2019-10-31 NOTE — Telephone Encounter (Signed)
I faxed to # listed old EMR notes and recent EMR notes with fax confirmation received.

## 2019-11-06 ENCOUNTER — Telehealth: Payer: Self-pay | Admitting: *Deleted

## 2019-11-06 ENCOUNTER — Other Ambulatory Visit: Payer: Self-pay | Admitting: *Deleted

## 2019-11-06 NOTE — Telephone Encounter (Signed)
Opened in error

## 2019-11-06 NOTE — Telephone Encounter (Signed)
I called [facility] spoke to Molli Hazard and he stated notes needed to be faxed to [facility].  I relayed did fax 10-31-19, but will fax again with ofv note and old EMR [phone]  [id number].  18 pgs.  Received fax confirmation. I spoke to [facility] and prescription written for rebid prefilled syringes 3 boxes and 3 refills (90 day supply). Fax confirmation received. [phone],   [phone].

## 2019-11-06 NOTE — Telephone Encounter (Signed)
[email or web address] [phone]) called to report rebiff needs to be applied to pt medical benefits. Medical Records need to be sent to pt's plan([employer] ph [phone] fax [phone])

## 2019-11-06 NOTE — Telephone Encounter (Signed)
Faxed signed refill for Rebif back to [facility] at [phone]. Received fax confirmation.

## 2019-11-06 NOTE — Telephone Encounter (Signed)
Sheri Dickerson @[email or web address]  has called to report in response to the PA submitted on 07-29 it was denied and an appeal needs to be submitted.  [name] asked it be noted pt is without medication.

## 2019-11-11 NOTE — Telephone Encounter (Signed)
Received fax from [place] rule ID # [id number] dated 11-08-19.  Received request from [place] for benefit information for pt REBIF related to MS.  Stated unable to provide benfit determination at this time w/o further information. Stated her plan of coverage does not cover preexisting conditions.  They must first conduct a review of her past medical hx to verify there are no preexisting conditions. Once determination is made will let us know.  [phone].

## 2019-11-14 ENCOUNTER — Telehealth: Payer: Self-pay | Admitting: Neurology

## 2019-11-14 NOTE — Telephone Encounter (Signed)
[facility]([place]) checking status of PA for interferon beta-1a (REBIF) 44 MCG/0.5ML SOSY injection. Would like nurse to call.

## 2019-11-18 ENCOUNTER — Telehealth: Payer: Self-pay | Admitting: Neurology

## 2019-11-18 NOTE — Telephone Encounter (Signed)
I called spoke to [place], [facility].  He was trying to find out status of PA for rebif for pt.  I relayed that I had received a letter 11-08-19 which stated that her Plan of coverage does not cover preexisting conditions.  In order to make a determination abd respond to request must first conduct a review of [place] PM[facility] to verify no pre existing conditions. Upon completion will promptly notify us of determination.  [phone].  I faxed this letter, PA denial, and fax cover sheets that sent MR to them x 2.  I called Mark at [facility]- at [phone] and he stated were waiting to hear back from pt.  I called pt and could not LMVM (full).

## 2019-11-18 NOTE — Telephone Encounter (Signed)
Yes noted, have tried to contact pt as well.

## 2019-11-18 NOTE — Telephone Encounter (Signed)
[facility] Purnell Shoemaker) update on PA for interferon beta-1a (REBIF) 44 MCG/0.5ML SOSY injection. Insurance need clarification from Pt. Pt voicemail is full can contact Pt. Unable to contact Pt.

## 2019-11-19 NOTE — Telephone Encounter (Signed)
I called and spoke to [facility] with [employer].  She will relay to [place], that pt is switching insurance to [facility], and will let us know when she has her information.  She is 3 wks out of medication. She stated they will reach out to pt as well.

## 2019-11-19 NOTE — Telephone Encounter (Addendum)
I connected with pt.  She is relayed that she is now with [employer] and is waiting on her insurance information.  She will send to me when she receives.  I called [facility] spoke to [place] and will relay to [place] with update on pt.  Pt has been 3 wks without medication.

## 2019-11-19 NOTE — Telephone Encounter (Signed)
Events noted. If she cannot obtain Rebif we should consider switching to another agent. We have previously discussed switching to oral meds but didn't want to switch during a pandemic. She hasn't been super consistent with taking the injections as is. Please discuss this with her, Ideally, she isn't off an MS medication, but I see there has been a delay with insurance issues. Is she wants to stay of rebif that is okay. I see we have been working hard to ensure she gets the medication asap.

## 2019-11-20 NOTE — Telephone Encounter (Signed)
I called pt and she has not heard from her [employer] concerning new insurance. (she has not checked her email.  She will she says and will let us know.  She is getting ready for kids todays (first day of school).  She did not want to change her therapy as been on REBIF for 18yrs and no problem with it.

## 2019-11-22 ENCOUNTER — Telehealth: Payer: Self-pay | Admitting: Neurology

## 2019-11-22 NOTE — Telephone Encounter (Signed)
[facility] Duwayne Heck) will not been able to dispense prescription for interferon beta-1a (REBIF) 44 MCG/0.5ML SOSY injection. Pt will have to fillout information for insurance. Insurance have mailed Pt the claimant form for her to fillout. [facility] spoke with insurance on 8/20 told us have not received form from Pt. Until form has been returned to the insurance cannot dispense  interferon beta-1a (REBIF) 44 MCG/0.5ML SOSY injection. [facility] has been following up every two days. [facility] cancelling prescription.

## 2019-12-18 ENCOUNTER — Telehealth: Payer: Self-pay | Admitting: *Deleted

## 2019-12-18 NOTE — Telephone Encounter (Signed)
I called pt Sheri Dickerson for her relating that have not heard back relating to her MS medication management.  Please return call when she can.

## 2019-12-26 ENCOUNTER — Telehealth: Payer: Self-pay | Admitting: Neurology

## 2019-12-26 NOTE — Telephone Encounter (Signed)
Left message for patient to call back. Per the last message, everyone is waiting on the patient to fill out her portion of the forms. Will need to speak with her to see if she has completed them or needs another copy.

## 2019-12-26 NOTE — Telephone Encounter (Signed)
Ashley(case manager) @ [facility] states pt informed them she has not had her rebiff in almost a month, Morrie Sheldon is asking if pt needs titration and if so a script will be needed, Morrie Sheldon stated a fax has been sent on this request as well.  Morrie Sheldon can be reached at [phone]

## 2020-02-13 ENCOUNTER — Ambulatory Visit: Payer: Self-pay | Admitting: Neurology

## 2020-03-01 IMAGING — DX DG ANKLE COMPLETE 3+V*R*
3 series · 3 of 3 positions shown · non-contrast
Comparison: None.

CLINICAL DATA: Right ankle pain after fall down stairs.

EXAM:
RIGHT ANKLE - COMPLETE 3+ VIEW

[ankle ap]
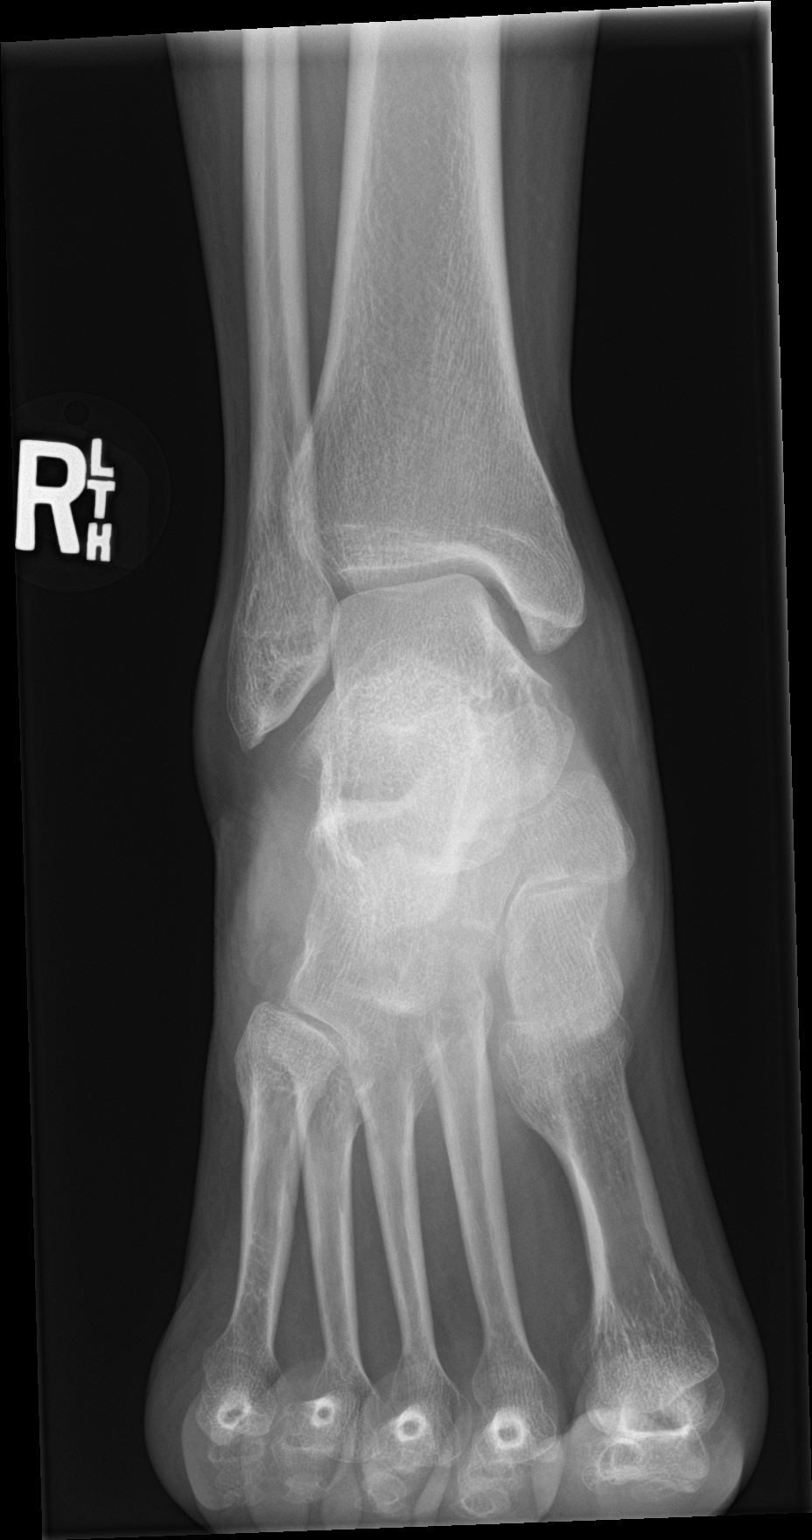

[ankle obl]
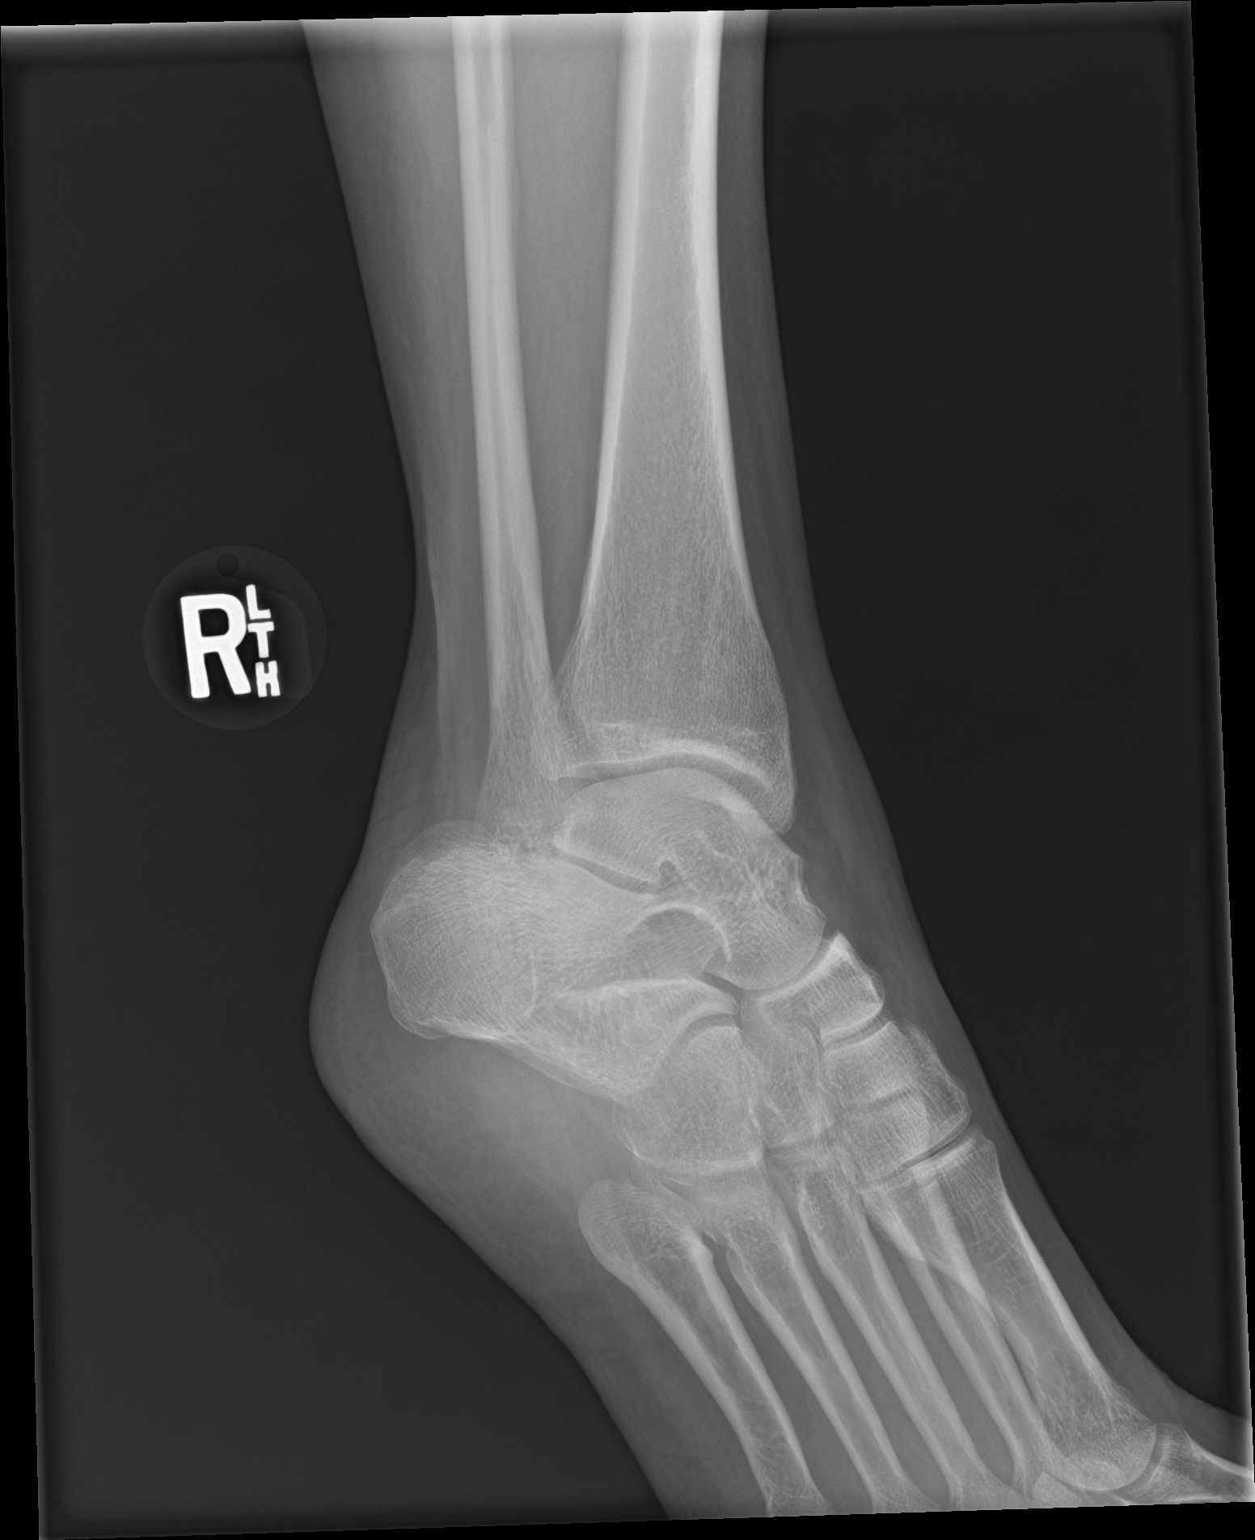

[ankle lat]
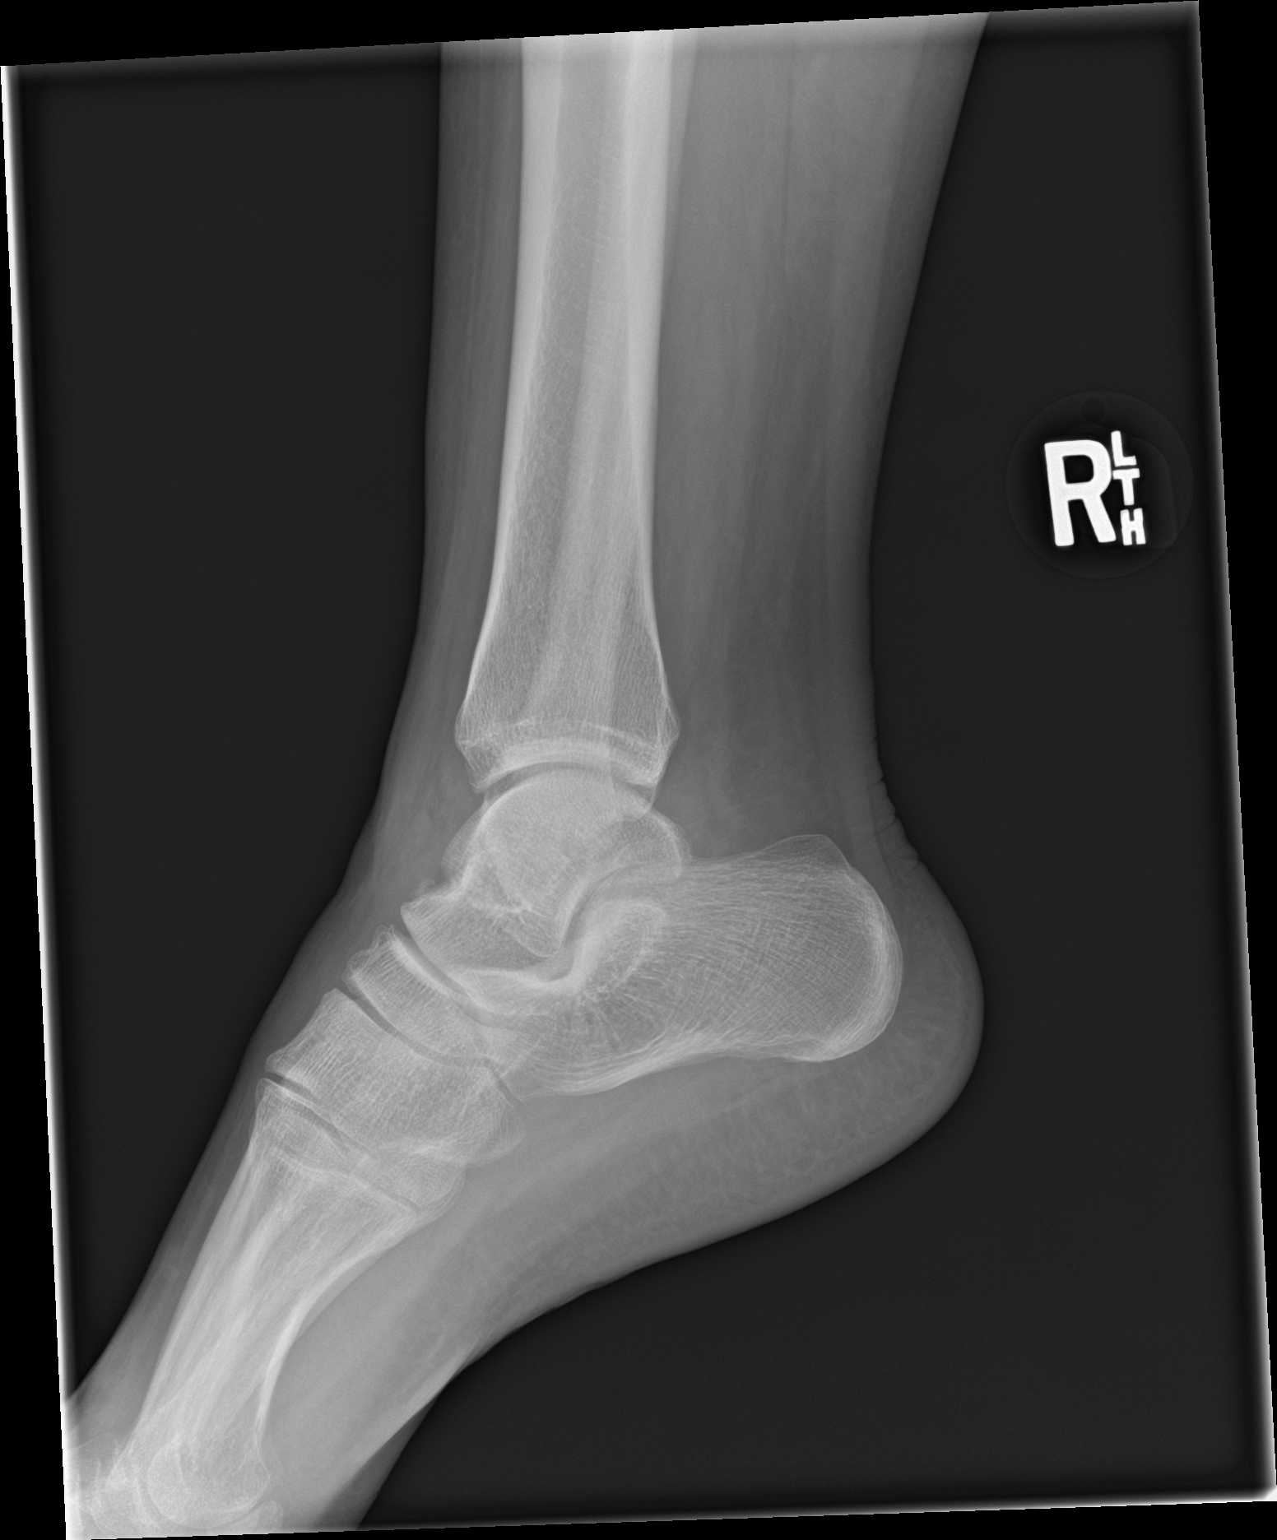

[3 of 3 positions shown; findings below may reference images not displayed]

FINDINGS: Small acute dorsal avulsion fracture from the talus. No additional
fracture of the ankle. There is no evidence of arthropathy or other
focal bone abnormality. Soft tissue edema associated with the dorsal
fracture.
IMPRESSION: Acute dorsal avulsion fracture from the anterior talus. Associated
soft tissue edema.

## 2020-04-02 ENCOUNTER — Encounter: Payer: Self-pay | Admitting: Neurology

## 2020-04-02 ENCOUNTER — Ambulatory Visit (INDEPENDENT_AMBULATORY_CARE_PROVIDER_SITE_OTHER): Payer: BC Managed Care – PPO | Admitting: Neurology

## 2020-04-02 VITALS — BP 121/80 | HR 82 | Ht 63.0 in | Wt 173.4 lb

## 2020-04-02 DIAGNOSIS — G35 Multiple sclerosis: Secondary | ICD-10-CM

## 2020-04-02 NOTE — Progress Notes (Signed)
I have read the note, and I agree with the clinical assessment and plan.  Karson Chicas K Tatanisha Cuthbert   

## 2020-04-02 NOTE — Patient Instructions (Signed)
Check labs today  Do some research on Aubagio  Order MRI of the brain and cervical spine  See you back in 3 months  Next week start process to taper the prednisone

## 2020-04-02 NOTE — Progress Notes (Signed)
PATIENT: Sheri Dickerson DOB: 09/16/1970  REASON FOR VISIT: follow up HISTORY FROM: patient  HISTORY OF PRESENT ILLNESS: Today 04/02/20 Sheri Dickerson is a 50 year old female with history of multiple sclerosis. She has been off Rebif for about 6 months due to her insurance not covering.  In October, her PCP started her on 10 mg daily prednisone to prevent MS problems.  In the 6 months she has been off Rebif has noted some tingling from the left foot, up the leg, no weakness.  More tendency to get cramps in both legs, especially with flexion of the feet.  She feels her equilibrium is off, but has not fallen, going down stairs thinks she may fall.  Has chronic urinary urgency, takes oxybutynin sporadically. At last visit MRI of the brain and cervical spine were ordered, but is yet to be completed. In the last year was started on Valtrex for herpes that showed up in blood work, there were no lesions or rash.  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  She is currently living in [place].  Presents today for evaluation unaccompanied.  HISTORY 08/13/2019 SS: Sheri Dickerson is a 50 year old female with history of multiple sclerosis.  She remains on Rebif.  We have previously discussed switching to oral medications for MS, but she has decided to stay on her current therapy, fearful of switching during pandemic, also personal experience of family members who haven't done well on oral medications. She hasn't been consistent about taking Rebif 3 times a week, usually takes twice.  She is growing tired of injections.  She has not been on oxybutynin, thought it would cause fluid retention.  She continues to report urinary frequency, urgency, leaking.  She has to wear pads.  Denies any new numbness or weakness, does report feels that the strength in her right hand is declining, could be to overuse, has been doing a lot of baking, relying on the right hand.  She has some balance instability, has  not fallen, but is very careful.  Will be seeing PCP tomorrow, going to discuss medication for anxiety, has a lot of stress going on, feeling overwhelmed.  She took this year off work from school, and is apprehensive about going back in August due to the pandemic.  She has been vaccinated with [employer] vaccine.  She presents today for evaluation unaccompanied.   REVIEW OF SYSTEMS: Out of a complete 14 system review of symptoms, the patient complains only of the following symptoms, and all other reviewed systems are negative.  tingling  ALLERGIES: Allergies  Allergen Reactions  . Prozac [Fluoxetine Hcl]     Hives    HOME MEDICATIONS: Outpatient Medications Prior to Visit  Medication Sig Dispense Refill  . acetaminophen (TYLENOL) 325 MG tablet Take 650 mg by mouth as needed.    Marland Kitchen albuterol (VENTOLIN HFA) 108 (90 Base) MCG/ACT inhaler Inhale 2 puffs into the lungs every 6 (six) hours as needed for wheezing or shortness of breath.    Marland Kitchen b complex vitamins tablet Take 1 tablet by mouth daily.    . busPIRone (BUSPAR) 10 MG tablet Take 10 mg by mouth 2 (two) times daily.    . cetirizine (ZYRTEC) 10 MG tablet Take 10 mg by mouth daily.    Marland Kitchen escitalopram (LEXAPRO) 20 MG tablet Take 20 mg by mouth daily.    . fluticasone (FLONASE) 50 MCG/ACT nasal spray     . hydrochlorothiazide (HYDRODIURIL) 25 MG  tablet Take 25 mg by mouth daily.  2  . interferon beta-1a (REBIF) 44 MCG/0.5ML SOSY injection INJECT ONE SYRINGE (44 MCG) SUBCUTANEOUSLY THREE TIMES PER WEEK. KEEPREFRIGERATED. 36 mL 3  . lansoprazole (PREVACID) 30 MG capsule Take 30 mg by mouth daily.    . magnesium gluconate (MAGONATE) 500 MG tablet Take 500 mg by mouth 2 (two) times daily.    . Multiple Vitamins-Minerals (MULTIVITAMIN GUMMIES ADULT PO) Take by mouth.    . Omega-3 Fatty Acids (OMEGA 3 PO) Take 1 tablet by mouth daily.    Marland Kitchen oxybutynin (DITROPAN-XL) 5 MG 24 hr tablet TAKE 1 TABLET BY MOUTH EVERYDAY AT BEDTIME 90 tablet 1  . Potassium 75  MG TABS Take by mouth.    . predniSONE (DELTASONE) 10 MG tablet Take 10 mg by mouth daily.    . sucralfate (CARAFATE) 1 g tablet Take 1 g by mouth as needed.    . valACYclovir (VALTREX) 500 MG tablet Take 500 mg by mouth daily.     Marland Kitchen VITAMIN D PO Take 1 tablet by mouth daily.    Marland Kitchen VITAMIN E PO Take 1 tablet by mouth daily.     No facility-administered medications prior to visit.    PAST MEDICAL HISTORY: Past Medical History:  Diagnosis Date  . Asthma   . Dyslipidemia   . Genital herpes   . Multiple sclerosis ([facility]) 04/22/2013    PAST SURGICAL HISTORY: Past Surgical History:  Procedure Laterality Date  . ABDOMINAL HYSTERECTOMY    . BREAST FIBROADENOMA SURGERY      FAMILY HISTORY: Family History  Problem Relation Age of Onset  . Hypertension Mother   . Breast cancer Mother   . Prostate cancer Father   . Asthma Maternal Grandmother   . Cancer Maternal Grandfather        throat cancer    SOCIAL HISTORY: Social History   Socioeconomic History  . Marital status: Single    Spouse name: Not on file  . Number of children: 0  . Years of education: college  . Highest education level: Not on file  Occupational History  . 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 Smoking status: Former Games developer  . Smokeless tobacco: Never Used  Vaping Use  . Vaping Use: Not on file  Substance and Sexual Activity  . Alcohol use: Yes    Comment: occasional  . Drug use: No  . Sexual activity: Not on file  Other Topics Concern  . Not on file  Social History Narrative   Lives at home w/ a friend   Right-handed   Occasional caffeine (~ once per week)   Social Determinants of Health   Financial Resource Strain: Not on file  Food Insecurity: Not on file  Transportation Needs: Not on file  Physical Activity: Not on file  Stress: Not on file  Social Connections: Not on file  Intimate Partner Violence: Not on file   PHYSICAL EXAM  Vitals:   04/02/20 1006   BP: 121/80  Pulse: 82  Weight: 173 lb 6.4 oz (78.7 kg)  Height: 5\' 3"  (1.6 m)   Body mass index is 30.72 kg/m.  Generalized: Well developed, in no acute distress   Neurological examination  Mentation: Alert oriented to time, place, history taking. Follows all commands speech and language fluent Cranial nerve II-XII: Pupils were equal round reactive to light. Extraocular movements were full, visual field were full on confrontational test. Facial sensation and strength were normal.  Head turning and shoulder shrug  were normal and symmetric. Motor: The motor testing reveals 5 over 5 strength of all 4 extremities. Good symmetric motor tone is noted throughout.  Sensory: slight decreased sensation to soft touch to the left lower extremity, pinprick is intact Coordination: Cerebellar testing reveals good finger-nose-finger and heel-to-shin bilaterally.  Gait and station: Gait is normal. Tandem gait is normal. Romberg is negative. No drift is seen.  Reflexes: Deep tendon reflexes are symmetric and normal bilaterally.   DIAGNOSTIC DATA (LABS, IMAGING, TESTING) - I reviewed patient records, labs, notes, testing and imaging myself where available.  Lab Results  Component Value Date   WBC 9.1 08/13/2019   HGB 12.0 08/13/2019   HCT 35.9 08/13/2019   MCV 94 08/13/2019   PLT 279 08/13/2019      Component Value Date/Time   NA 141 08/13/2019 1340   K 4.1 08/13/2019 1340   CL 98 08/13/2019 1340   CO2 26 08/13/2019 1340   GLUCOSE 91 08/13/2019 1340   BUN 13 08/13/2019 1340   CREATININE 0.81 08/13/2019 1340   CALCIUM 9.7 08/13/2019 1340   PROT 7.1 08/13/2019 1340   ALBUMIN 4.5 08/13/2019 1340   AST 20 08/13/2019 1340   ALT 17 08/13/2019 1340   ALKPHOS 111 08/13/2019 1340   BILITOT 0.2 08/13/2019 1340   GFRNONAA 86 08/13/2019 1340   GFRAA 99 08/13/2019 1340   No results found for: CHOL, HDL, LDLCALC, LDLDIRECT, TRIG, CHOLHDL No results found for: HGBA1C No results found for:  VITAMINB12 No results found for: TSH  ASSESSMENT AND PLAN 50 y.o. year old female  has a past medical history of Asthma, Dyslipidemia, Genital herpes, and Multiple sclerosis ([place]) (04/22/2013). here with:  1.  Relapsing remitting multiple sclerosis -Plan to switch to Aubagio, has been off Rebif for 6 months  -Check CBC, CMP today  -Check MRI of the brain, cervical spine -Once labs result, will talk to [facility] about getting paperwork started next week for Aubagio -When plan in place, will taper off prednisone 1 mg every 2 weeks, currently on 10 mg, not clear this is doing anything for MS -Follow-up in 3 months or sooner if needed -Gave pamphlet, info for MS society web-site to review Aubagio  I spent 30 minutes of face-to-face and non-face-to-face time with patient.  This included previsit chart review, lab review, study review, order entry, electronic health record documentation, patient education.  Butler Denmark, AGNP-C, DNP 04/02/2020, 10:44 AM [facility] [address] [place], [place] [address] [phone]

## 2020-04-03 LAB — COMPREHENSIVE METABOLIC PANEL
ALT: 16 IU/L (ref 0–32)
AST: 19 IU/L (ref 0–40)
Albumin/Globulin Ratio: 1.7 (ref 1.2–2.2)
Albumin: 4.5 g/dL (ref 3.8–4.8)
Alkaline Phosphatase: 100 IU/L (ref 44–121)
BUN/Creatinine Ratio: 16 (ref 9–23)
BUN: 14 mg/dL (ref 6–24)
Bilirubin Total: 0.3 mg/dL (ref 0.0–1.2)
CO2: 27 mmol/L (ref 20–29)
Calcium: 9.8 mg/dL (ref 8.7–10.2)
Chloride: 99 mmol/L (ref 96–106)
Creatinine, Ser: 0.85 mg/dL (ref 0.57–1.00)
GFR calc Af Amer: 92 mL/min/{1.73_m2} (ref >59)
GFR calc non Af Amer: 80 mL/min/{1.73_m2} (ref >59)
Globulin, Total: 2.6 g/dL (ref 1.5–4.5)
Glucose: 85 mg/dL (ref 65–99)
Potassium: 3.8 mmol/L (ref 3.5–5.2)
Sodium: 140 mmol/L (ref 134–144)
Total Protein: 7.1 g/dL (ref 6.0–8.5)

## 2020-04-03 LAB — CBC WITH DIFFERENTIAL/PLATELET
Basophils Absolute: 0.1 10*3/uL (ref 0.0–0.2)
Basos: 0 %
EOS (ABSOLUTE): 0.1 10*3/uL (ref 0.0–0.4)
Eos: 0 %
Hematocrit: 36.7 % (ref 34.0–46.6)
Hemoglobin: 12.8 g/dL (ref 11.1–15.9)
Immature Grans (Abs): 0.1 10*3/uL (ref 0.0–0.1)
Immature Granulocytes: 1 %
Lymphocytes Absolute: 2.7 10*3/uL (ref 0.7–3.1)
Lymphs: 16 %
MCH: 32.5 pg (ref 26.6–33.0)
MCHC: 34.9 g/dL (ref 31.5–35.7)
MCV: 93 fL (ref 79–97)
Monocytes Absolute: 0.7 10*3/uL (ref 0.1–0.9)
Monocytes: 4 %
Neutrophils Absolute: 13.2 10*3/uL — ABNORMAL HIGH (ref 1.4–7.0)
Neutrophils: 79 %
Platelets: 278 10*3/uL (ref 150–450)
RBC: 3.94 x10E6/uL (ref 3.77–5.28)
RDW: 14.4 % (ref 11.7–15.4)
WBC: 16.8 10*3/uL — ABNORMAL HIGH (ref 3.4–10.8)

## 2020-04-06 ENCOUNTER — Telehealth: Payer: Self-pay | Admitting: Neurology

## 2020-04-06 NOTE — Progress Notes (Signed)
See result note for labs today 04-06-20.

## 2020-04-06 NOTE — Progress Notes (Signed)
I have sent this to [place], RN assist with the aubagio start.

## 2020-04-06 NOTE — Telephone Encounter (Signed)
I called pt and relayed that her WBC elevated.  She does have urinary freq and burning.  Has not made appt with pcp yet but will.  Would not start aubagio until normal, will proceed with getting enrollment started.  Phone disconnected.

## 2020-04-06 NOTE — Telephone Encounter (Signed)
Labs show elevated WBC 16.8, any sign of infection? None reported? Can we repeat this week at her PCP in [place], wouldn't want to start Aubagio until normalized. CMP was normal. Need to work on paperwork start form for Aubagio. Will need monthly morning of liver enzymes (hepatic function panel) monthly for 1st 6 months.

## 2020-04-07 ENCOUNTER — Telehealth: Payer: Self-pay | Admitting: Neurology

## 2020-04-07 MED ORDER — PREDNISONE 1 MG PO TABS: 4.0000 mg | ORAL_TABLET | Freq: Every day | ORAL | 0 refills | Status: DC

## 2020-04-07 MED ORDER — PREDNISONE 5 MG PO TABS: 5.0000 mg | ORAL_TABLET | Freq: Every day | ORAL | 3 refills | Status: DC

## 2020-04-07 NOTE — Telephone Encounter (Signed)
no to the covid questions MR Brain w/wo contrast & MR Cervical spine w/wo contrast [facility] auth: [id number] (exp. 04/06/20 to 10/02/20). Patient is scheduled at [facility] for 04/14/20

## 2020-04-07 NOTE — Addendum Note (Signed)
Addended by: Glean Salvo on: 04/07/2020 10:39 AM   Modules accepted: Orders

## 2020-04-07 NOTE — Telephone Encounter (Signed)
I called pt relayed that she is to taper prednisone by 1mg  every 2 wks. Starting tomorrow 9mg  for 2wks then 8 mg etc.  2 strengths escribed for her. (5 and 1 mg tabs).  She may see if pcp wants to do things differently but this would be what SS/ NP is doing.. she reiterated, verbalized understanding.  Will call back as needed.

## 2020-04-07 NOTE — Telephone Encounter (Signed)
Taper prednisone 1 mg every 2 weeks, go ahead and cut back to 9 mg daily. PCP started this, they may have different recommendations for discontinuation. [facility] [place], 5 mg and 1 mg tablets. Will decrease dose by 1 mg every 2 weeks. Will send new script then.

## 2020-04-07 NOTE — Telephone Encounter (Signed)
I called pt.  I relayed that per SS/NP would proceed with plan, getting on aubagio once her WBC/infection normalized, will need labs to show this.  She asked about getting off prednisone, this will be tapered per last ofv note and prescriotion [facility] [place], [place].  I relayed that pre SS/NP that aubagio requires LFT to be checked every months for the first 6 months.  She verbalized understanding.

## 2020-04-13 NOTE — Telephone Encounter (Signed)
I tried to get a hold of the patient to inform her that our office isn't opening until noon on 04/14/20 to reschedule her MRI appt but her mailbox is full and I was unable to leave a voicemail. I also didn't see a dpr for me to call anyone else.

## 2020-04-14 ENCOUNTER — Other Ambulatory Visit: Payer: BC Managed Care – PPO

## 2020-04-14 ENCOUNTER — Encounter: Payer: Self-pay | Admitting: Neurology

## 2020-04-14 NOTE — Telephone Encounter (Signed)
Patient showed up to her appointment and I informed her that I called her several times yesterday but her voicemail was full. I informed the patient of this and she is rescheduled for tomorrow 04/15/20.

## 2020-04-15 ENCOUNTER — Other Ambulatory Visit: Payer: Self-pay

## 2020-04-15 ENCOUNTER — Ambulatory Visit: Payer: BC Managed Care – PPO

## 2020-04-15 DIAGNOSIS — G35 Multiple sclerosis: Secondary | ICD-10-CM | POA: Diagnosis not present

## 2020-04-15 MED ORDER — GADOBENATE DIMEGLUMINE 529 MG/ML IV SOLN
15.0000 mL | Freq: Once | INTRAVENOUS | Status: AC | PRN
Start: 2020-04-15 — End: 2020-04-15
  Administered 2020-04-15: 15 mL via INTRAVENOUS

## 2020-04-20 ENCOUNTER — Telehealth: Payer: Self-pay | Admitting: Neurology

## 2020-04-20 NOTE — Telephone Encounter (Signed)
Please call the patient, MRI of the brain was overall stable, no significant change from January 2019.  MRI of cervical spine showed no cord lesions.  Please check on status of repeat labs, Aubagio processing, make sure weaning off prednisone?  MRI of the brain with and without contrast 04/15/20 IMPRESSION:   MRI brain with and without contrast demonstrating: -Multiple supratentorial and infratentorial chronic demyelinating plaques. -No acute plaques.  No significant change from 04/17/2017.  MRI of the cervical spine with and without contrast   IMPRESSION:   Normal MRI cervical spine (with and without). No spinal cord lesions.   Chronic demyelinating plaques in the midbrain and pons noted, better visualized on MRI brain from same day.

## 2020-04-20 NOTE — Telephone Encounter (Signed)
Called pt and she went to pcp, no growth on culture, did do a urinestrip, and gave her Mac[facility].  She is due to have repeat WBC 04-27-20 at pcp.  Will email her aubagio form to sign so when it is needed will be ready to send.  [email or web address]).  Relayed her MRI results brain and cervical.  She verbalized understanding.

## 2020-05-11 ENCOUNTER — Ambulatory Visit: Payer: Self-pay | Admitting: Neurology

## 2020-07-01 NOTE — Progress Notes (Signed)
PATIENT: Sheri Dickerson DOB: 10-15-1970  REASON FOR VISIT: follow up HISTORY FROM: patient  HISTORY OF PRESENT ILLNESS: Today 07/02/20 Sheri Dickerson is a 50 year old female with history of MS. Off Rebif for almost a year, when last seen in January, plan to switch to Aubagio.  MRI of the brain and cervical spine in January 2022 were overall stable, no cord lesions seen on cervical spine.  In January, WBC was elevated at 16.8, treated for UTI.  CMP was normal.  Is off prednisone.  MS overall stable.  Seeing urology, going to start pelvic floor therapy.  Still agreeable to start Aubagio.  Here today for evaluation unaccompanied.  Update 04/02/2020 SS: Sheri Dickerson is a 50 year old female with history of multiple sclerosis. She has been off Rebif for about 6 months due to her insurance not covering.  In October, her PCP started her on 10 mg daily prednisone to prevent MS problems.  In the 6 months she has been off Rebif has noted some tingling from the left foot, up the leg, no weakness.  More tendency to get cramps in both legs, especially with flexion of the feet.  She feels her equilibrium is off, but has not fallen, going down stairs thinks she may fall.  Has chronic urinary urgency, takes oxybutynin sporadically. At last visit MRI of the brain and cervical spine were ordered, but is yet to be completed. In the last year was started on Valtrex for herpes that showed up in blood work, there were no lesions or rash.  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  She is currently living in [place].  Presents today for evaluation unaccompanied.  HISTORY 08/13/2019 SS: Sheri Dickerson is a 50 year old female with history of multiple sclerosis.  She remains on Rebif.  We have previously discussed switching to oral medications for MS, but she has decided to stay on her current therapy, fearful of switching during pandemic, also personal experience of family members who haven't done well on  oral medications. She hasn't been consistent about taking Rebif 3 times a week, usually takes twice.  She is growing tired of injections.  She has not been on oxybutynin, thought it would cause fluid retention.  She continues to report urinary frequency, urgency, leaking.  She has to wear pads.  Denies any new numbness or weakness, does report feels that the strength in her right hand is declining, could be to overuse, has been doing a lot of baking, relying on the right hand.  She has some balance instability, has not fallen, but is very careful.  Will be seeing PCP tomorrow, going to discuss medication for anxiety, has a lot of stress going on, feeling overwhelmed.  She took this year off work from school, and is apprehensive about going back in August due to the pandemic.  She has been vaccinated with J & J vaccine.  She presents today for evaluation unaccompanied.   REVIEW OF SYSTEMS: Out of a complete 14 system review of symptoms, the patient complains only of the following symptoms, and all other reviewed systems are negative.  N/A  ALLERGIES: Allergies  Allergen Reactions  . Prozac [Fluoxetine Hcl]     Hives    HOME MEDICATIONS: Outpatient Medications Prior to Visit  Medication Sig Dispense Refill  . acetaminophen (TYLENOL) 325 MG tablet Take 650 mg by mouth as needed.    Marland Kitchen albuterol (VENTOLIN HFA) 108 (90 Base) MCG/ACT inhaler Inhale 2 puffs into  the lungs every 6 (six) hours as needed for wheezing or shortness of breath.    Marland Kitchen b complex vitamins tablet Take 1 tablet by mouth daily.    . busPIRone (BUSPAR) 10 MG tablet Take 10 mg by mouth 2 (two) times daily.    . cetirizine (ZYRTEC) 10 MG tablet Take 10 mg by mouth daily.    Marland Kitchen escitalopram (LEXAPRO) 20 MG tablet Take 20 mg by mouth daily.    . fluticasone (FLONASE) 50 MCG/ACT nasal spray     . hydrochlorothiazide (HYDRODIURIL) 25 MG tablet Take 25 mg by mouth daily.  2  . lansoprazole (PREVACID) 30 MG capsule Take 30 mg by mouth  daily.    . magnesium gluconate (MAGONATE) 500 MG tablet Take 500 mg by mouth 2 (two) times daily.    . Multiple Vitamins-Minerals (MULTIVITAMIN GUMMIES ADULT PO) Take by mouth.    . Omega-3 Fatty Acids (OMEGA 3 PO) Take 1 tablet by mouth daily.    . Potassium 75 MG TABS Take by mouth.    . sucralfate (CARAFATE) 1 g tablet Take 1 g by mouth as needed.    . Trospium Chloride 60 MG CP24 Take 1 capsule by mouth every morning.    . valACYclovir (VALTREX) 500 MG tablet Take 500 mg by mouth daily.     Marland Kitchen VITAMIN D PO Take 1 tablet by mouth daily.    Marland Kitchen VITAMIN E PO Take 1 tablet by mouth daily.    . interferon beta-1a (REBIF) 44 MCG/0.5ML SOSY injection INJECT ONE SYRINGE (44 MCG) SUBCUTANEOUSLY THREE TIMES PER WEEK. KEEPREFRIGERATED. 36 mL 3  . oxybutynin (DITROPAN-XL) 5 MG 24 hr tablet TAKE 1 TABLET BY MOUTH EVERYDAY AT BEDTIME 90 tablet 1  . predniSONE (DELTASONE) 1 MG tablet Take 4 tablets (4 mg total) by mouth daily with breakfast. 60 tablet 0  . predniSONE (DELTASONE) 5 MG tablet Take 1 tablet (5 mg total) by mouth daily with breakfast. 30 tablet 3   No facility-administered medications prior to visit.    PAST MEDICAL HISTORY: Past Medical History:  Diagnosis Date  . Asthma   . Dyslipidemia   . Genital herpes   . Multiple sclerosis (HCC) 04/22/2013    PAST SURGICAL HISTORY: Past Surgical History:  Procedure Laterality Date  . ABDOMINAL HYSTERECTOMY    . BREAST FIBROADENOMA SURGERY      FAMILY HISTORY: Family History  Problem Relation Age of Onset  . Hypertension Mother   . Breast cancer Mother   . Prostate cancer Father   . Asthma Maternal Grandmother   . Cancer Maternal Grandfather        throat cancer    SOCIAL HISTORY: Social History   Socioeconomic History  . Marital status: Single    Spouse name: Not on file  . Number of children: 0  . Years of education: college  . Highest education level: Not on file  Occupational History  . 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 Smoking status: Former Games developer  . Smokeless tobacco: Never Used  Vaping Use  . Vaping Use: Not on file  Substance and Sexual Activity  . Alcohol use: Yes    Comment: occasional  . Drug use: No  . Sexual activity: Not on file  Other Topics Concern  . Not on file  Social History Narrative   Lives at home w/ a friend   Right-handed   Occasional caffeine (~ once per week)   Social Determinants of  Health   Financial Resource Strain: Not on file  Food Insecurity: Not on file  Transportation Needs: Not on file  Physical Activity: Not on file  Stress: Not on file  Social Connections: Not on file  Intimate Partner Violence: Not on file   PHYSICAL EXAM  Vitals:   07/02/20 1451  BP: (!) 120/92  Pulse: 92  Weight: 170 lb (77.1 kg)  Height: 5' 3.5" (1.613 m)   Body mass index is 29.64 kg/m.  Generalized: Well developed, in no acute distress   Neurological examination  Mentation: Alert oriented to time, place, history taking. Follows all commands speech and language fluent Cranial nerve II-XII: Pupils were equal round reactive to light. Extraocular movements were full, visual field were full on confrontational test. Facial sensation and strength were normal. Head turning and shoulder shrug  were normal and symmetric. Motor: The motor testing reveals 5 over 5 strength of all 4 extremities. Good symmetric motor tone is noted throughout.  Sensory: slight decreased sensation to soft touch to the left lower extremity, pinprick is intact Coordination: Cerebellar testing reveals good finger-nose-finger and heel-to-shin bilaterally.  Gait and station: Gait is normal. Tandem gait is normal. Romberg is negative. No drift is seen.  Reflexes: Deep tendon reflexes are symmetric and normal bilaterally.   DIAGNOSTIC DATA (LABS, IMAGING, TESTING) - I reviewed patient records, labs, notes, testing and imaging myself where available.  Lab Results  Component  Value Date   WBC 16.8 (H) 04/02/2020   HGB 12.8 04/02/2020   HCT 36.7 04/02/2020   MCV 93 04/02/2020   PLT 278 04/02/2020      Component Value Date/Time   NA 140 04/02/2020 1120   K 3.8 04/02/2020 1120   CL 99 04/02/2020 1120   CO2 27 04/02/2020 1120   GLUCOSE 85 04/02/2020 1120   BUN 14 04/02/2020 1120   CREATININE 0.85 04/02/2020 1120   CALCIUM 9.8 04/02/2020 1120   PROT 7.1 04/02/2020 1120   ALBUMIN 4.5 04/02/2020 1120   AST 19 04/02/2020 1120   ALT 16 04/02/2020 1120   ALKPHOS 100 04/02/2020 1120   BILITOT 0.3 04/02/2020 1120   GFRNONAA 80 04/02/2020 1120   GFRAA 92 04/02/2020 1120   No results found for: CHOL, HDL, LDLCALC, LDLDIRECT, TRIG, CHOLHDL No results found for: SFKC1E No results found for: VITAMINB12 No results found for: TSH  ASSESSMENT AND PLAN 50 y.o. year old female  has a past medical history of Asthma, Dyslipidemia, Genital herpes, and Multiple sclerosis (HCC) (04/22/2013). here with:  1.  Relapsing remitting multiple sclerosis -Plan to switch to Aubagio, has been off Rebif for almost 1 year -Check CBC, CMP today  -In January 2022 MRI brain was stable, multiple supratentorial and infratentorial chronic demyelinating plaques -In January 2022 MRI cervical spine showed no cord lesions -Off prednisone -Signed paperwork for Aubagio start, will continue with process -Follow-up in 3 months or sooner if needed  I spent 30 minutes of face-to-face and non-face-to-face time with patient.  This included previsit chart review, lab review, study review, order entry, electronic health record documentation, patient education.  Margie Ege, AGNP-C, DNP 07/02/2020, 3:20 PM [facility] [address] [place], [place] [address] [phone]

## 2020-07-02 ENCOUNTER — Encounter: Payer: Self-pay | Admitting: Neurology

## 2020-07-02 ENCOUNTER — Ambulatory Visit: Payer: BC Managed Care – PPO | Admitting: Neurology

## 2020-07-02 VITALS — BP 120/92 | HR 92 | Ht 63.5 in | Wt 170.0 lb

## 2020-07-02 DIAGNOSIS — G35 Multiple sclerosis: Secondary | ICD-10-CM

## 2020-07-02 NOTE — Patient Instructions (Signed)
Check labs today  Sign papers for Aubagio start  See you back in 3 months

## 2020-07-02 NOTE — Progress Notes (Signed)
I have read the note, and I agree with the clinical assessment and plan.  Lindell Renfrew K Mariem Skolnick   

## 2020-07-03 LAB — CBC WITH DIFFERENTIAL/PLATELET
Basophils Absolute: 0.1 10*3/uL (ref 0.0–0.2)
Basos: 1 %
EOS (ABSOLUTE): 0.1 10*3/uL (ref 0.0–0.4)
Eos: 1 %
Hematocrit: 38.6 % (ref 34.0–46.6)
Hemoglobin: 12.9 g/dL (ref 11.1–15.9)
Immature Grans (Abs): 0 10*3/uL (ref 0.0–0.1)
Immature Granulocytes: 0 %
Lymphocytes Absolute: 3.4 10*3/uL — ABNORMAL HIGH (ref 0.7–3.1)
Lymphs: 32 %
MCH: 31.2 pg (ref 26.6–33.0)
MCHC: 33.4 g/dL (ref 31.5–35.7)
MCV: 94 fL (ref 79–97)
Monocytes Absolute: 0.6 10*3/uL (ref 0.1–0.9)
Monocytes: 5 %
Neutrophils Absolute: 6.4 10*3/uL (ref 1.4–7.0)
Neutrophils: 61 %
Platelets: 276 10*3/uL (ref 150–450)
RBC: 4.13 x10E6/uL (ref 3.77–5.28)
RDW: 12.9 % (ref 11.7–15.4)
WBC: 10.5 10*3/uL (ref 3.4–10.8)

## 2020-07-03 LAB — COMPREHENSIVE METABOLIC PANEL
ALT: 12 IU/L (ref 0–32)
AST: 14 IU/L (ref 0–40)
Albumin/Globulin Ratio: 1.6 (ref 1.2–2.2)
Albumin: 4.6 g/dL (ref 3.8–4.8)
Alkaline Phosphatase: 104 IU/L (ref 44–121)
BUN/Creatinine Ratio: 18 (ref 9–23)
BUN: 15 mg/dL (ref 6–24)
Bilirubin Total: 0.2 mg/dL (ref 0.0–1.2)
CO2: 23 mmol/L (ref 20–29)
Calcium: 9.9 mg/dL (ref 8.7–10.2)
Chloride: 97 mmol/L (ref 96–106)
Creatinine, Ser: 0.82 mg/dL (ref 0.57–1.00)
Globulin, Total: 2.8 g/dL (ref 1.5–4.5)
Glucose: 123 mg/dL — ABNORMAL HIGH (ref 65–99)
Potassium: 4.1 mmol/L (ref 3.5–5.2)
Sodium: 138 mmol/L (ref 134–144)
Total Protein: 7.4 g/dL (ref 6.0–8.5)
eGFR: 87 mL/min/{1.73_m2} (ref >59)

## 2020-07-06 ENCOUNTER — Telehealth: Payer: Self-pay

## 2020-07-06 DIAGNOSIS — Z79899 Other long term (current) drug therapy: Secondary | ICD-10-CM

## 2020-07-06 DIAGNOSIS — G35 Multiple sclerosis: Secondary | ICD-10-CM

## 2020-07-06 NOTE — Telephone Encounter (Signed)
Received Aubagio start form from Maralyn Sago, NP.  Faxed completed start form to [facility].  Started PA on CMM. Key: [id number].  Waiting on clinical questions.

## 2020-07-07 NOTE — Telephone Encounter (Signed)
I called [facility].  Completed PA for Aubagio via phone.  I have not received clinical questions from the PA on CMM.  The PA has been submitted and we should have a determination within 24 to 72 hours.

## 2020-07-08 ENCOUNTER — Telehealth: Payer: Self-pay

## 2020-07-08 NOTE — Telephone Encounter (Signed)
Pt verified by name and DOB, results given per provider, pt voiced understanding all question answered. 

## 2020-07-08 NOTE — Telephone Encounter (Signed)
-----   Message from Glean Salvo, NP sent at 07/03/2020  9:34 AM EDT ----- Blood work looks good, mildly elevated random glucose level 123. Will proceed with starting Aubagio.

## 2020-07-09 NOTE — Telephone Encounter (Signed)
Neal Dy, with [facility], is also aware of approval.

## 2020-07-09 NOTE — Telephone Encounter (Signed)
Received notice of APPROVAL from [facility] for Aubagio 14mg  from 07/07/2020-07/07/2021.  PA# [facility] Plan [phone] ER

## 2020-07-13 NOTE — Telephone Encounter (Signed)
I called patient.  I advised her that Ashok Cordia was approved by her insurance.  She should be getting a call to set up shipment of Aubagio from [facility].  She will check her voicemails.  I reminded her of the once monthly LFTs for 6 months.  She would prefer to use either her doctor's office or [facility] in [place], [place].  Advised her I will check with her next week to see if she has received medication and set up that lab work schedule.  Patient verbalized understanding.

## 2020-07-21 NOTE — Telephone Encounter (Signed)
I called patient.  She has not received any phone calls about setting up delivery of Aubagio.  She did get a letter from [facility] asking her to call them.  She will [facility] today.  I will check with her next week but she was encouraged to call me if any questions or concerns arise in the meantime.

## 2020-07-28 NOTE — Addendum Note (Signed)
Addended by: Geronimo Running A on: 07/28/2020 10:53 AM   Modules accepted: Orders

## 2020-07-28 NOTE — Telephone Encounter (Signed)
I called patient.  She has received Aubagio and started taking it on Jul 26, 2020.  Patient is tolerating it well with no side effects.  Patient will be using a [facility] in [place], [place] for her once monthly lab work for the next 6 months.  I called [facility]. The fax number for the location in [place], [place] is : [phone]. I will fax them this order.

## 2020-08-26 ENCOUNTER — Other Ambulatory Visit: Payer: Self-pay | Admitting: Neurology

## 2020-08-27 LAB — HEPATIC FUNCTION PANEL
ALT: 16 IU/L (ref 0–32)
AST: 23 IU/L (ref 0–40)
Albumin: 4.6 g/dL (ref 3.8–4.8)
Alkaline Phosphatase: 105 IU/L (ref 44–121)
Bilirubin Total: 0.2 mg/dL (ref 0.0–1.2)
Bilirubin, Direct: <0.1 mg/dL (ref 0.00–0.40)
Total Protein: 7.1 g/dL (ref 6.0–8.5)

## 2020-08-31 ENCOUNTER — Telehealth: Payer: Self-pay

## 2020-08-31 NOTE — Telephone Encounter (Signed)
-----   Message from Glean Salvo, NP sent at 08/27/2020 10:58 AM EDT ----- Sheri Dickerson, please let her know liver function panel was normal. On Aubagio, started Jul 26, 2020. Will need to check monthly for 1st 6 months.  Let me know if she has any questions or problems.

## 2020-08-31 NOTE — Telephone Encounter (Signed)
Pt verified by name and DOB,  normal results given per provider, pt voiced understanding all question answered. °

## 2020-10-07 ENCOUNTER — Telehealth: Payer: Self-pay | Admitting: Neurology

## 2020-10-07 NOTE — Telephone Encounter (Signed)
LVM for patient to return call. 

## 2020-10-07 NOTE — Telephone Encounter (Signed)
Pt called stating that her lab order that was sent to the [facility] in [place] has been lost and she is needing it to be resent to them. Please advise.

## 2020-10-07 NOTE — Telephone Encounter (Signed)
LVM for patient to return call.        When patient calls back, please inform her "the lab orders have been refaxed to [facility] in [place]"

## 2020-10-07 NOTE — Telephone Encounter (Signed)
Refaxed lab orders to [facility] in [place]. Fax # [phone]  OK transmission received.

## 2020-10-08 ENCOUNTER — Ambulatory Visit: Payer: BC Managed Care – PPO | Admitting: Neurology

## 2020-10-08 ENCOUNTER — Encounter: Payer: Self-pay | Admitting: Neurology

## 2020-10-08 VITALS — BP 135/85 | HR 106 | Ht 63.0 in | Wt 168.0 lb

## 2020-10-08 DIAGNOSIS — G35 Multiple sclerosis: Secondary | ICD-10-CM

## 2020-10-08 DIAGNOSIS — Z79899 Other long term (current) drug therapy: Secondary | ICD-10-CM

## 2020-10-08 NOTE — Progress Notes (Signed)
PATIENT: Sheri Dickerson DOB: Apr 03, 1970  REASON FOR VISIT: follow up HISTORY FROM: patient Primary Neurologist: Dr. Anne Hahn   Today 10/08/20 Sheri Dickerson is a 50 year old female with history of MS, now on Aubagio.  Started taking Jul 26, 2020.  Is having monthly labs done in [place]. Doing a lot to help her family members while out on summer break, not getting much rest. Sleeping too much, shoulders are sore from where she is laying, used to be hips, got steroid shots, getting injections in shoulders next week. Missed her massage therapy appointment this month. Likes the [employer], loves that its not a shot. LFT was normal in June, overdue this month, issue at her [employer], will check it here today. Muscles stiffen if she doesn't exercise or stretch. Overall, feels stable. Main issue is urinary urgency, seeing urology.  Update 07/02/2020 SS: Sheri Dickerson is a 50 year old female with history of MS. Off Rebif for almost a year, when last seen in January, plan to switch to Aubagio.  MRI of the brain and cervical spine in January 2022 were overall stable, no cord lesions seen on cervical spine.  In January, WBC was elevated at 16.8, treated for UTI.  CMP was normal.  Is off prednisone.  MS overall stable.  Seeing urology, going to start pelvic floor therapy.  Still agreeable to start Aubagio.  Here today for evaluation unaccompanied.  Update 04/02/2020 SS: Sheri Dickerson is a 51 year old female with history of multiple sclerosis. She has been off Rebif for about 6 months due to her insurance not covering.  In October, her PCP started her on 10 mg daily prednisone to prevent MS problems.  In the 6 months she has been off Rebif has noted some tingling from the left foot, up the leg, no weakness.  More tendency to get cramps in both legs, especially with flexion of the feet.  She feels her equilibrium is off, but has not fallen, going down stairs thinks she may fall.  Has chronic urinary urgency, takes oxybutynin  sporadically. At last visit MRI of the brain and cervical spine were ordered, but is yet to be completed. In the last year was started on Valtrex for herpes that showed up in blood work, there were no lesions or rash.  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  She is currently living in [place].  Presents today for evaluation unaccompanied.  HISTORY 08/13/2019 SS: Sheri Dickerson is a 50 year old female with history of multiple sclerosis.  She remains on Rebif.  We have previously discussed switching to oral medications for MS, but she has decided to stay on her current therapy, fearful of switching during pandemic, also personal experience of family members who haven't done well on oral medications. She hasn't been consistent about taking Rebif 3 times a week, usually takes twice.  She is growing tired of injections.  She has not been on oxybutynin, thought it would cause fluid retention.  She continues to report urinary frequency, urgency, leaking.  She has to wear pads.  Denies any new numbness or weakness, does report feels that the strength in her right hand is declining, could be to overuse, has been doing a lot of baking, relying on the right hand.  She has some balance instability, has not fallen, but is very careful.  Will be seeing PCP tomorrow, going to discuss medication for anxiety, has a lot of stress going on, feeling overwhelmed.  She took this year  off work from school, and is apprehensive about going back in August due to the pandemic.  She has been vaccinated with J & J vaccine.  She presents today for evaluation unaccompanied.    REVIEW OF SYSTEMS: Out of a complete 14 system review of symptoms, the patient complains only of the following symptoms, and all other reviewed systems are negative.  See HPI  ALLERGIES: Allergies  Allergen Reactions   Prozac [Fluoxetine Hcl]     Hives    HOME MEDICATIONS: Outpatient Medications Prior to Visit  Medication Sig  Dispense Refill   acetaminophen (TYLENOL) 325 MG tablet Take 650 mg by mouth as needed.     albuterol (VENTOLIN HFA) 108 (90 Base) MCG/ACT inhaler Inhale 2 puffs into the lungs every 6 (six) hours as needed for wheezing or shortness of breath.     b complex vitamins tablet Take 1 tablet by mouth daily.     busPIRone (BUSPAR) 10 MG tablet Take 10 mg by mouth 2 (two) times daily.     cetirizine (ZYRTEC) 10 MG tablet Take 10 mg by mouth daily.     escitalopram (LEXAPRO) 20 MG tablet Take 20 mg by mouth daily.     fluticasone (FLONASE) 50 MCG/ACT nasal spray      hydrochlorothiazide (HYDRODIURIL) 25 MG tablet Take 25 mg by mouth daily.  2   lansoprazole (PREVACID) 30 MG capsule Take 30 mg by mouth daily.     magnesium gluconate (MAGONATE) 500 MG tablet Take 500 mg by mouth 2 (two) times daily.     Multiple Vitamins-Minerals (MULTIVITAMIN GUMMIES ADULT PO) Take by mouth.     Omega-3 Fatty Acids (OMEGA 3 PO) Take 1 tablet by mouth daily.     Potassium 75 MG TABS Take by mouth as needed.     sucralfate (CARAFATE) 1 g tablet Take 1 g by mouth as needed.     Teriflunomide (AUBAGIO) 14 MG TABS Take 14 mg by mouth daily.     Trospium Chloride 60 MG CP24 Take 1 capsule by mouth every morning.     valACYclovir (VALTREX) 500 MG tablet Take 500 mg by mouth daily.      VITAMIN D PO Take 1 tablet by mouth daily.     VITAMIN E PO Take 1 tablet by mouth daily.     No facility-administered medications prior to visit.    PAST MEDICAL HISTORY: Past Medical History:  Diagnosis Date   Asthma    Dyslipidemia    Genital herpes    Multiple sclerosis (HCC) 04/22/2013    PAST SURGICAL HISTORY: Past Surgical History:  Procedure Laterality Date   ABDOMINAL HYSTERECTOMY     BREAST FIBROADENOMA SURGERY      FAMILY HISTORY: Family History  Problem Relation Age of Onset   Hypertension Mother    Breast cancer Mother    Prostate cancer Father    Asthma Maternal Grandmother    Cancer Maternal Grandfather         throat cancer    SOCIAL HISTORY: Social History   Socioeconomic History   Marital status: Single    Spouse name: Not on file   Number of children: 0   Years of education: college   Highest education level: Not on file  Occupational History   Occupation: Runner, broadcasting/film/video    Comment: [employer]  Tobacco Use   Smoking status: Former   Smokeless tobacco: Never  Building services engineer Use: Not on file  Substance and Sexual Activity  Alcohol use: Yes    Comment: occasional   Drug use: No   Sexual activity: Not on file  Other Topics Concern   Not on file  Social History Narrative   Lives at home w/ a friend   Right-handed   Occasional caffeine (~ once per week)   Social Determinants of Health   Financial Resource Strain: Not on file  Food Insecurity: Not on file  Transportation Needs: Not on file  Physical Activity: Not on file  Stress: Not on file  Social Connections: Not on file  Intimate Partner Violence: Not on file   PHYSICAL EXAM  Vitals:   10/08/20 1309  BP: 135/85  Pulse: (!) 106  Weight: 168 lb (76.2 kg)  Height: 5\' 3"  (1.6 m)    Body mass index is 29.76 kg/m.  Generalized: Well developed, in no acute distress   Neurological examination  Mentation: Alert oriented to time, place, history taking. Follows all commands speech and language fluent Cranial nerve II-XII: Pupils were equal round reactive to light. Extraocular movements were full, visual field were full on confrontational test. Facial sensation and strength were normal. Head turning and shoulder shrug  were normal and symmetric. Motor: The motor testing reveals 5 over 5 strength of all 4 extremities. Good symmetric motor tone is noted throughout.  Sensory: Soft touch sensation is intact all extremities Coordination: Cerebellar testing reveals good finger-nose-finger and heel-to-shin bilaterally.  Gait and station: Gait is normal. Tandem gait is normal.  Reflexes: Deep tendon reflexes are  symmetric and normal bilaterally.   DIAGNOSTIC DATA (LABS, IMAGING, TESTING) - I reviewed patient records, labs, notes, testing and imaging myself where available.  Lab Results  Component Value Date   WBC 10.5 07/02/2020   HGB 12.9 07/02/2020   HCT 38.6 07/02/2020   MCV 94 07/02/2020   PLT 276 07/02/2020      Component Value Date/Time   NA 138 07/02/2020 1525   K 4.1 07/02/2020 1525   CL 97 07/02/2020 1525   CO2 23 07/02/2020 1525   GLUCOSE 123 (H) 07/02/2020 1525   BUN 15 07/02/2020 1525   CREATININE 0.82 07/02/2020 1525   CALCIUM 9.9 07/02/2020 1525   PROT 7.1 08/26/2020 1704   ALBUMIN 4.6 08/26/2020 1704   AST 23 08/26/2020 1704   ALT 16 08/26/2020 1704   ALKPHOS 105 08/26/2020 1704   BILITOT 0.2 08/26/2020 1704   GFRNONAA 80 04/02/2020 1120   GFRAA 92 04/02/2020 1120   No results found for: CHOL, HDL, LDLCALC, LDLDIRECT, TRIG, CHOLHDL No results found for: 05/31/2020 No results found for: VITAMINB12 No results found for: TSH  ASSESSMENT AND PLAN 50 y.o. year old female  has a past medical history of Asthma, Dyslipidemia, Genital herpes, and Multiple sclerosis (HCC) (04/22/2013). here with:  1.  Relapsing remitting multiple sclerosis -On Aubagio since May 2021, doing well, tolerating excellent, MS remains stable, will continue Aubagio  -Check LFTs today, lives in [place], gets labs at [place] but issue this month, will check today; needs monthly for 1st 6 months  -In January 2022 MRI brain was stable, multiple supratentorial and infratentorial chronic demyelinating plaques -In January 2022 MRI cervical spine showed no cord lesions -Encouraged to start consistent exercise, stretching regimen  -Follow-up in 6 months or sooner if needed  I spent 30 minutes of face-to-face and non-face-to-face time with patient.  This included previsit chart review, lab review, study review, discussing medications, labs, management, follow-up.  February 2022, AGNP-C, DNP 10/08/2020, 1:39  PM  [facility] [address], [address] [place], [place] [address] [phone]   

## 2020-10-08 NOTE — Patient Instructions (Signed)
Continue Sheri Dickerson Will check liver function today  Need to check monthly for 1st 6 months See you back in 6 months

## 2020-10-08 NOTE — Progress Notes (Signed)
I have read the note, and I agree with the clinical assessment and plan.  Sheri Dickerson   

## 2020-10-09 LAB — HEPATIC FUNCTION PANEL
ALT: 22 IU/L (ref 0–32)
AST: 23 IU/L (ref 0–40)
Albumin: 4.5 g/dL (ref 3.8–4.8)
Alkaline Phosphatase: 116 IU/L (ref 44–121)
Bilirubin Total: <0.2 mg/dL (ref 0.0–1.2)
Bilirubin, Direct: <0.1 mg/dL (ref 0.00–0.40)
Total Protein: 6.9 g/dL (ref 6.0–8.5)

## 2020-10-12 ENCOUNTER — Telehealth: Payer: Self-pay | Admitting: *Deleted

## 2020-10-12 NOTE — Telephone Encounter (Signed)
-----   Message from Glean Salvo, NP sent at 10/12/2020  9:38 AM EDT ----- Please call, hepatic function panel is normal. Checking new start on Aubagio, monthly for 1st 6 months on Augabio.

## 2020-10-12 NOTE — Telephone Encounter (Signed)
I spoke to the patient. She verbalized understanding of the lab findings. She will continue Aubagio.

## 2020-11-17 ENCOUNTER — Telehealth: Payer: Self-pay

## 2020-11-17 DIAGNOSIS — Z79899 Other long term (current) drug therapy: Secondary | ICD-10-CM

## 2020-11-17 NOTE — Telephone Encounter (Signed)
I called patient.  She is overdue for her monthly LFTs on Aubagio.  No answer, left a voicemail asking her to call me back.  If patient returns my call please remind her to complete her lab work.  Patient uses the [facility] in [place], [place].  They have been sent the LFT order.

## 2020-11-17 NOTE — Telephone Encounter (Signed)
Pt returned call relayed the message and she states she will get her labs done this week.

## 2020-11-23 NOTE — Telephone Encounter (Signed)
I called patient.  She was unable to get her labs done last week but plans on getting her labs done this week.

## 2020-12-01 ENCOUNTER — Other Ambulatory Visit: Payer: Self-pay | Admitting: Neurology

## 2020-12-02 ENCOUNTER — Telehealth: Payer: Self-pay

## 2020-12-02 LAB — HEPATIC FUNCTION PANEL
ALT: 20 IU/L (ref 0–32)
AST: 22 IU/L (ref 0–40)
Albumin: 4.8 g/dL (ref 3.8–4.8)
Alkaline Phosphatase: 102 IU/L (ref 44–121)
Bilirubin Total: 0.2 mg/dL (ref 0.0–1.2)
Bilirubin, Direct: <0.1 mg/dL (ref 0.00–0.40)
Total Protein: 7.2 g/dL (ref 6.0–8.5)

## 2020-12-02 NOTE — Telephone Encounter (Signed)
-----   Message from York Spaniel, MD sent at 12/02/2020  3:10 PM EDT ----- Liver profile is unremarkable on Aubagio.  Recheck next month. Please call the patient. ----- Message ----- From: Nell Range Lab Results In Sent: 12/02/2020   7:37 AM EDT To: York Spaniel, MD

## 2020-12-02 NOTE — Telephone Encounter (Signed)
I called the pt and advised of results. Pt verbalized understanding and will proceed with re check of labs next month.   I will let the MS coordinator know about these results.

## 2021-01-11 ENCOUNTER — Telehealth: Payer: Self-pay

## 2021-01-11 DIAGNOSIS — G35 Multiple sclerosis: Secondary | ICD-10-CM

## 2021-01-11 DIAGNOSIS — Z79899 Other long term (current) drug therapy: Secondary | ICD-10-CM

## 2021-01-11 NOTE — Telephone Encounter (Signed)
I called patient. I reminded her to complete her last month of LFTs. Patient uses the [facility] in [place], [place] and they have been sent this LFT order. Pt verbalized understanding.

## 2021-01-14 NOTE — Telephone Encounter (Signed)
I called pt she stated that they told her they did not receive faxes.I called corporate and she stated that all [facility] receive faxes and she gave me the fax # [phone] to the [facility].  I spoke to our [employer], she stated that if she releases the order all [facility] should see the order.  I relayed to pt and she will go by there tomorrow and get drawn.  She appreciated call back.

## 2021-01-14 NOTE — Addendum Note (Signed)
Addended by: Rogene Houston on: 01/14/2021 04:30 PM   Modules accepted: Orders

## 2021-01-14 NOTE — Telephone Encounter (Signed)
Pt has called to report that she went to the [facility] in [place], [place]  and they have no orders.  Pt states they informed her they have no way of getting faxes and the only way they will get the order is thru electronic file or fax

## 2021-01-15 ENCOUNTER — Other Ambulatory Visit: Payer: Self-pay | Admitting: Neurology

## 2021-01-16 LAB — HEPATIC FUNCTION PANEL
ALT: 13 IU/L (ref 0–32)
AST: 16 IU/L (ref 0–40)
Albumin: 4.6 g/dL (ref 3.8–4.8)
Alkaline Phosphatase: 107 IU/L (ref 44–121)
Bilirubin Total: <0.2 mg/dL (ref 0.0–1.2)
Bilirubin, Direct: <0.1 mg/dL (ref 0.00–0.40)
Total Protein: 7.3 g/dL (ref 6.0–8.5)

## 2021-01-18 ENCOUNTER — Telehealth: Payer: Self-pay

## 2021-01-18 NOTE — Telephone Encounter (Signed)
I called the pt back and advised of results. Pt verbalized understanding and appreciation for the call.

## 2021-01-18 NOTE — Telephone Encounter (Signed)
I attempted to call the pt, call went to straight to vm that was not set up. Will try again at a later time.

## 2021-01-18 NOTE — Telephone Encounter (Signed)
-----   Message from York Spaniel, MD sent at 01/17/2021  4:00 PM EDT -----  The blood work results are unremarkable. Please call the patient.  ----- Message ----- From: Nell Range Lab Results In Sent: 01/16/2021   5:36 AM EDT To: York Spaniel, MD

## 2021-02-17 ENCOUNTER — Telehealth: Payer: Self-pay

## 2021-02-17 DIAGNOSIS — Z79899 Other long term (current) drug therapy: Secondary | ICD-10-CM

## 2021-02-17 DIAGNOSIS — G35 Multiple sclerosis: Secondary | ICD-10-CM

## 2021-02-17 NOTE — Telephone Encounter (Signed)
I called patient. She is due for her last LFTs. Patient reports that she currently has Covid and so will stop by the [facility] when she recovers in the next week or two.  Faxed standing LFT order to [facility] in [place] at [phone]. Received a receipt of confirmation.

## 2021-02-17 NOTE — Addendum Note (Signed)
Addended by: Geronimo Running A on: 02/17/2021 02:00 PM   Modules accepted: Orders

## 2021-02-17 NOTE — Telephone Encounter (Signed)
Dr. Epimenio Foot gave VO for LFTs in Dr. Anne Hahn and Maralyn Sago, NP's absence. Rogene Houston, [facility] tech has released these orders so that these can be drawn in [place], [place] [facility].

## 2021-03-26 LAB — HEPATIC FUNCTION PANEL
ALT: 13 IU/L (ref 0–32)
AST: 16 IU/L (ref 0–40)
Albumin: 4.5 g/dL (ref 3.8–4.9)
Alkaline Phosphatase: 109 IU/L (ref 44–121)
Bilirubin Total: <0.2 mg/dL (ref 0.0–1.2)
Bilirubin, Direct: <0.1 mg/dL (ref 0.00–0.40)
Total Protein: 7 g/dL (ref 6.0–8.5)

## 2021-04-19 NOTE — Progress Notes (Deleted)
PATIENT: Sheri Dickerson DOB: [birth year]  REASON FOR VISIT: follow up MS HISTORY FROM: patient Primary Neurologist: Dr. Anne Hahn   Today 04/19/21 Sheri Dickerson is here today for follow-up with history of MS on Aubagio since May 2022.  Update 10/08/20 SS: Sheri Dickerson is a 51 year old female with history of MS, now on Aubagio.  Started taking Jul 26, 2020.  Is having monthly labs done in [place]. Doing a lot to help her family members while out on summer break, not getting much rest. Sleeping too much, shoulders are sore from where she is laying, used to be hips, got steroid shots, getting injections in shoulders next week. Missed her massage therapy appointment this month. Likes the [employer], loves that its not a shot. LFT was normal in June, overdue this month, issue at her [employer] [employer], will check it here today. Muscles stiffen if she doesn't exercise or stretch. Overall, feels stable. Main issue is urinary urgency, seeing urology.  Update 07/02/2020 SS: Sheri Dickerson is a 51 year old female with history of MS. Off Rebif for almost a year, when last seen in January, plan to switch to Aubagio.  MRI of the brain and cervical spine in January 2022 were overall stable, no cord lesions seen on cervical spine.  In January, WBC was elevated at 16.8, treated for UTI.  CMP was normal.  Is off prednisone.  MS overall stable.  Seeing urology, going to start pelvic floor therapy.  Still agreeable to start Aubagio.  Here today for evaluation unaccompanied.  Update 04/02/2020 SS: Sheri Dickerson is a 51 year old female with history of multiple sclerosis. She has been off Rebif for about 6 months due to her insurance not covering.  In October, her PCP started her on 10 mg daily prednisone to prevent MS problems.  In the 6 months she has been off Rebif has noted some tingling from the left foot, up the leg, no weakness.  More tendency to get cramps in both legs, especially with flexion of the feet.  She feels her equilibrium is off,  but has not fallen, going down stairs thinks she may fall.  Has chronic urinary urgency, takes oxybutynin sporadically. At last visit MRI of the brain and cervical spine were ordered, but is yet to be completed. In the last year was started on Valtrex for herpes that showed up in blood work, there were no lesions or rash.  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  She is currently living in [place].  Presents today for evaluation unaccompanied.  HISTORY 08/13/2019 SS: Sheri Dickerson is a 51 year old female with history of multiple sclerosis.  She remains on Rebif.  We have previously discussed switching to oral medications for MS, but she has decided to stay on her current therapy, fearful of switching during pandemic, also personal experience of family members who haven't done well on oral medications. She hasn't been consistent about taking Rebif 3 times a week, usually takes twice.  She is growing tired of injections.  She has not been on oxybutynin, thought it would cause fluid retention.  She continues to report urinary frequency, urgency, leaking.  She has to wear pads.  Denies any new numbness or weakness, does report feels that the strength in her right hand is declining, could be to overuse, has been doing a lot of baking, relying on the right hand.  She has some balance instability, has not fallen, but is very careful.  Will be seeing PCP tomorrow,  going to discuss medication for anxiety, has a lot of stress going on, feeling overwhelmed.  She took this year off work from school, and is apprehensive about going back in August due to the pandemic.  She has been vaccinated with [employer] vaccine.  She presents today for evaluation unaccompanied.    REVIEW OF SYSTEMS: Out of a complete 14 system review of symptoms, the patient complains only of the following symptoms, and all other reviewed systems are negative.  See HPI  ALLERGIES: Allergies  Allergen Reactions   Prozac  [Fluoxetine Hcl]     Hives    HOME MEDICATIONS: Outpatient Medications Prior to Visit  Medication Sig Dispense Refill   acetaminophen (TYLENOL) 325 MG tablet Take 650 mg by mouth as needed.     albuterol (VENTOLIN HFA) 108 (90 Base) MCG/ACT inhaler Inhale 2 puffs into the lungs every 6 (six) hours as needed for wheezing or shortness of breath.     b complex vitamins tablet Take 1 tablet by mouth daily.     busPIRone (BUSPAR) 10 MG tablet Take 10 mg by mouth 2 (two) times daily.     cetirizine (ZYRTEC) 10 MG tablet Take 10 mg by mouth daily.     escitalopram (LEXAPRO) 20 MG tablet Take 20 mg by mouth daily.     fluticasone (FLONASE) 50 MCG/ACT nasal spray      hydrochlorothiazide (HYDRODIURIL) 25 MG tablet Take 25 mg by mouth daily.  2   lansoprazole (PREVACID) 30 MG capsule Take 30 mg by mouth daily.     magnesium gluconate (MAGONATE) 500 MG tablet Take 500 mg by mouth 2 (two) times daily.     Multiple Vitamins-Minerals (MULTIVITAMIN GUMMIES ADULT PO) Take by mouth.     Omega-3 Fatty Acids (OMEGA 3 PO) Take 1 tablet by mouth daily.     Potassium 75 MG TABS Take by mouth as needed.     sucralfate (CARAFATE) 1 g tablet Take 1 g by mouth as needed.     Teriflunomide (AUBAGIO) 14 MG TABS Take 14 mg by mouth daily.     Trospium Chloride 60 MG CP24 Take 1 capsule by mouth every morning.     valACYclovir (VALTREX) 500 MG tablet Take 500 mg by mouth daily.      VITAMIN D PO Take 1 tablet by mouth daily.     VITAMIN E PO Take 1 tablet by mouth daily.     No facility-administered medications prior to visit.    PAST MEDICAL HISTORY: Past Medical History:  Diagnosis Date   Asthma    Dyslipidemia    Genital herpes    Multiple sclerosis (HCC) 04/22/2013    PAST SURGICAL HISTORY: Past Surgical History:  Procedure Laterality Date   ABDOMINAL HYSTERECTOMY     BREAST FIBROADENOMA SURGERY      FAMILY HISTORY: Family History  Problem Relation Age of Onset   Hypertension Mother    Breast  cancer Mother    Prostate cancer Father    Asthma Maternal Grandmother    Cancer Maternal Grandfather        throat cancer    SOCIAL HISTORY: Social History   Socioeconomic History   Marital status: Single    Spouse name: Not on file   Number of children: 0   Years of education: college   Highest education level: Not on file  Occupational History   Occupation: Teacher    Comment: [employer]  Tobacco Use   Smoking status: Former  Smokeless tobacco: Never  Vaping Use   Vaping Use: Not on file  Substance and Sexual Activity   Alcohol use: Yes    Comment: occasional   Drug use: No   Sexual activity: Not on file  Other Topics Concern   Not on file  Social History Narrative   Lives at home w/ a friend   Right-handed   Occasional caffeine (~ once per week)   Social Determinants of Health   Financial Resource Strain: Not on file  Food Insecurity: Not on file  Transportation Needs: Not on file  Physical Activity: Not on file  Stress: Not on file  Social Connections: Not on file  Intimate Partner Violence: Not on file   PHYSICAL EXAM  There were no vitals filed for this visit.   There is no height or weight on file to calculate BMI.  Generalized: Well developed, in no acute distress   Neurological examination  Mentation: Alert oriented to time, place, history taking. Follows all commands speech and language fluent Cranial nerve II-XII: Pupils were equal round reactive to light. Extraocular movements were full, visual field were full on confrontational test. Facial sensation and strength were normal. Head turning and shoulder shrug  were normal and symmetric. Motor: The motor testing reveals 5 over 5 strength of all 4 extremities. Good symmetric motor tone is noted throughout.  Sensory: Soft touch sensation is intact all extremities Coordination: Cerebellar testing reveals good finger-nose-finger and heel-to-shin bilaterally.  Gait and station: Gait is  normal. Tandem gait is normal.  Reflexes: Deep tendon reflexes are symmetric and normal bilaterally.   DIAGNOSTIC DATA (LABS, IMAGING, TESTING) - I reviewed patient records, labs, notes, testing and imaging myself where available.  Lab Results  Component Value Date   WBC 10.5 07/02/2020   HGB 12.9 07/02/2020   HCT 38.6 07/02/2020   MCV 94 07/02/2020   PLT 276 07/02/2020      Component Value Date/Time   NA 138 07/02/2020 1525   K 4.1 07/02/2020 1525   CL 97 07/02/2020 1525   CO2 23 07/02/2020 1525   GLUCOSE 123 (H) 07/02/2020 1525   BUN 15 07/02/2020 1525   CREATININE 0.82 07/02/2020 1525   CALCIUM 9.9 07/02/2020 1525   PROT 7.0 03/25/2021 0827   ALBUMIN 4.5 03/25/2021 0827   AST 16 03/25/2021 0827   ALT 13 03/25/2021 0827   ALKPHOS 109 03/25/2021 0827   BILITOT <0.2 03/25/2021 0827   GFRNONAA 80 04/02/2020 1120   GFRAA 92 04/02/2020 1120   No results found for: CHOL, HDL, LDLCALC, LDLDIRECT, TRIG, CHOLHDL No results found for: ZLDJ5T No results found for: VITAMINB12 No results found for: TSH  ASSESSMENT AND PLAN 51 y.o. year old female  has a past medical history of Asthma, Dyslipidemia, Genital herpes, and Multiple sclerosis (HCC) (04/22/2013). here with:  1.  Relapsing remitting multiple sclerosis -On Aubagio since May 2022, doing well, tolerating excellent, MS remains stable, will continue Aubagio  -Check LFTs today, lives in [place], gets labs at [facility] but issue this month, will check today; needs monthly for 1st 6 months  -In January 2022 MRI brain was stable, multiple supratentorial and infratentorial chronic demyelinating plaques -In January 2022 MRI cervical spine showed no cord lesions -Encouraged to start consistent exercise, stretching regimen  -Follow-up in 6 months or sooner if needed   Margie Ege, Edrick Oh, DNP 04/19/2021, 8:59 PM [facility] [address] [place], [place] [address] [phone]

## 2021-04-20 ENCOUNTER — Encounter: Payer: Self-pay | Admitting: Neurology

## 2021-04-20 ENCOUNTER — Ambulatory Visit: Payer: BC Managed Care – PPO | Admitting: Neurology

## 2021-06-22 ENCOUNTER — Telehealth: Payer: Self-pay

## 2021-06-22 NOTE — Telephone Encounter (Signed)
Received PA request for aubagio from [facility]. Completed and faxed back to [facility]. Received a receipt of confirmation. ? ?

## 2021-06-28 NOTE — Telephone Encounter (Signed)
PA for Sheri Dickerson was approved from 06/22/2021-06/23/2022. PA # [id number]. ?

## 2021-07-21 ENCOUNTER — Telehealth: Payer: Self-pay | Admitting: Neurology

## 2021-07-21 ENCOUNTER — Other Ambulatory Visit: Payer: Self-pay | Admitting: Neurology

## 2021-07-21 MED ORDER — AUBAGIO 14 MG PO TABS: ORAL_TABLET | ORAL | 3 refills | Status: DC

## 2021-07-21 NOTE — Telephone Encounter (Signed)
Refill sent to pharmacy for brand name Aubagio. Marked DAW on rx. ?

## 2021-07-21 NOTE — Telephone Encounter (Signed)
Refills have been sent to the requested pharmacy.  

## 2021-07-21 NOTE — Telephone Encounter (Signed)
Pt requesting refill for Teriflunomide (AUBAGIO) 14 MG TABS at [facility]. ?

## 2021-07-21 NOTE — Telephone Encounter (Signed)
Pt requesting new prescription for AUBAGIO not generic.  ?Pt went to [facility] today 07/21/2021 to pick up prescription.  ?States generic brand of AUBAGIO had copay of $100, which pt cannot afford. ? Pt states there is no copay assistance for generic brand of AUBAGIO. ?Would like a call back if there is a problem.  ?

## 2021-11-02 NOTE — Progress Notes (Unsigned)
PATIENT: Sheri Dickerson DOB: [birth year]  REASON FOR VISIT: follow up HISTORY FROM: patient Primary Neurologist: Dr. Anne Hahn   Today 11/02/21 Sheri Dickerson   Update 10/08/2020 SS: Sheri Dickerson is a 51 year old female with history of MS, now on Aubagio.  Started taking Jul 26, 2020.  Is having monthly labs done in [place]. Doing a lot to help her family members while out on summer break, not getting much rest. Sleeping too much, shoulders are sore from where she is laying, used to be hips, got steroid shots, getting injections in shoulders next week. Missed her massage therapy appointment this month. Likes the [employer], loves that its not a shot. LFT was normal in June, overdue this month, issue at her [employer], will check it here today. Muscles stiffen if she doesn't exercise or stretch. Overall, feels stable. Main issue is urinary urgency, seeing urology.  Update 07/02/2020 SS: Sheri Dickerson is a 51 year old female with history of MS. Off Rebif for almost a year, when last seen in January, plan to switch to Aubagio.  MRI of the brain and cervical spine in January 2022 were overall stable, no cord lesions seen on cervical spine.  In January, WBC was elevated at 16.8, treated for UTI.  CMP was normal.  Is off prednisone.  MS overall stable.  Seeing urology, going to start pelvic floor therapy.  Still agreeable to start Aubagio.  Here today for evaluation unaccompanied.  Update 04/02/2020 SS: Sheri Dickerson is a 51 year old female with history of multiple sclerosis. She has been off Rebif for about 6 months due to her insurance not covering.  In October, her PCP started her on 10 mg daily prednisone to prevent MS problems.  In the 6 months she has been off Rebif has noted some tingling from the left foot, up the leg, no weakness.  More tendency to get cramps in both legs, especially with flexion of the feet.  She feels her equilibrium is off, but has not fallen, going down stairs thinks she may fall.  Has chronic  urinary urgency, takes oxybutynin sporadically. At last visit MRI of the brain and cervical spine were ordered, but is yet to be completed. In the last year was started on Valtrex for herpes that showed up in blood work, there were no lesions or rash.  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  She is currently living in [place].  Presents today for evaluation unaccompanied.  HISTORY 08/13/2019 SS: Sheri Dickerson is a 51 year old female with history of multiple sclerosis.  She remains on Rebif.  We have previously discussed switching to oral medications for MS, but she has decided to stay on her current therapy, fearful of switching during pandemic, also personal experience of family members who haven't done well on oral medications. She hasn't been consistent about taking Rebif 3 times a week, usually takes twice.  She is growing tired of injections.  She has not been on oxybutynin, thought it would cause fluid retention.  She continues to report urinary frequency, urgency, leaking.  She has to wear pads.  Denies any new numbness or weakness, does report feels that the strength in her right hand is declining, could be to overuse, has been doing a lot of baking, relying on the right hand.  She has some balance instability, has not fallen, but is very careful.  Will be seeing PCP tomorrow, going to discuss medication for anxiety, has a lot of stress going on,  feeling overwhelmed.  She took this year off work from school, and is apprehensive about going back in August due to the pandemic.  She has been vaccinated with [employer] vaccine.  She presents today for evaluation unaccompanied.    REVIEW OF SYSTEMS: Out of a complete 14 system review of symptoms, the patient complains only of the following symptoms, and all other reviewed systems are negative.  See HPI  ALLERGIES: Allergies  Allergen Reactions   Prozac [Fluoxetine Hcl]     Hives    HOME MEDICATIONS: Outpatient Medications  Prior to Visit  Medication Sig Dispense Refill   acetaminophen (TYLENOL) 325 MG tablet Take 650 mg by mouth as needed.     albuterol (VENTOLIN HFA) 108 (90 Base) MCG/ACT inhaler Inhale 2 puffs into the lungs every 6 (six) hours as needed for wheezing or shortness of breath.     AUBAGIO 14 MG TABS Take 1 tablet by mouth 1 time a day 30 tablet 3   b complex vitamins tablet Take 1 tablet by mouth daily.     busPIRone (BUSPAR) 10 MG tablet Take 10 mg by mouth 2 (two) times daily.     cetirizine (ZYRTEC) 10 MG tablet Take 10 mg by mouth daily.     escitalopram (LEXAPRO) 20 MG tablet Take 20 mg by mouth daily.     fluticasone (FLONASE) 50 MCG/ACT nasal spray      hydrochlorothiazide (HYDRODIURIL) 25 MG tablet Take 25 mg by mouth daily.  2   lansoprazole (PREVACID) 30 MG capsule Take 30 mg by mouth daily.     magnesium gluconate (MAGONATE) 500 MG tablet Take 500 mg by mouth 2 (two) times daily.     Multiple Vitamins-Minerals (MULTIVITAMIN GUMMIES ADULT PO) Take by mouth.     Omega-3 Fatty Acids (OMEGA 3 PO) Take 1 tablet by mouth daily.     Potassium 75 MG TABS Take by mouth as needed.     sucralfate (CARAFATE) 1 g tablet Take 1 g by mouth as needed.     Trospium Chloride 60 MG CP24 Take 1 capsule by mouth every morning.     valACYclovir (VALTREX) 500 MG tablet Take 500 mg by mouth daily.      VITAMIN D PO Take 1 tablet by mouth daily.     VITAMIN E PO Take 1 tablet by mouth daily.     No facility-administered medications prior to visit.    PAST MEDICAL HISTORY: Past Medical History:  Diagnosis Date   Asthma    Dyslipidemia    Genital herpes    Multiple sclerosis (HCC) 04/22/2013    PAST SURGICAL HISTORY: Past Surgical History:  Procedure Laterality Date   ABDOMINAL HYSTERECTOMY     BREAST FIBROADENOMA SURGERY      FAMILY HISTORY: Family History  Problem Relation Age of Onset   Hypertension Mother    Breast cancer Mother    Prostate cancer Father    Asthma Maternal Grandmother     Cancer Maternal Grandfather        throat cancer    SOCIAL HISTORY: Social History   Socioeconomic History   Marital status: Single    Spouse name: Not on file   Number of children: 0   Years of education: college   Highest education level: Not on file  Occupational History   Occupation: Runner, broadcasting/film/video    Comment: [employer]  Tobacco Use   Smoking status: Former   Smokeless tobacco: Never  Building services engineer Use:  Not on file  Substance and Sexual Activity   Alcohol use: Yes    Comment: occasional   Drug use: No   Sexual activity: Not on file  Other Topics Concern   Not on file  Social History Narrative   Lives at home w/ a friend   Right-handed   Occasional caffeine (~ once per week)   Social Determinants of Health   Financial Resource Strain: Not on file  Food Insecurity: Not on file  Transportation Needs: Not on file  Physical Activity: Not on file  Stress: Not on file  Social Connections: Not on file  Intimate Partner Violence: Not on file   PHYSICAL EXAM  There were no vitals filed for this visit.   There is no height or weight on file to calculate BMI.  Generalized: Well developed, in no acute distress   Neurological examination  Mentation: Alert oriented to time, place, history taking. Follows all commands speech and language fluent Cranial nerve II-XII: Pupils were equal round reactive to light. Extraocular movements were full, visual field were full on confrontational test. Facial sensation and strength were normal. Head turning and shoulder shrug  were normal and symmetric. Motor: The motor testing reveals 5 over 5 strength of all 4 extremities. Good symmetric motor tone is noted throughout.  Sensory: Soft touch sensation is intact all extremities Coordination: Cerebellar testing reveals good finger-nose-finger and heel-to-shin bilaterally.  Gait and station: Gait is normal. Tandem gait is normal.  Reflexes: Deep tendon reflexes are  symmetric and normal bilaterally.   DIAGNOSTIC DATA (LABS, IMAGING, TESTING) - I reviewed patient records, labs, notes, testing and imaging myself where available.  Lab Results  Component Value Date   WBC 10.5 07/02/2020   HGB 12.9 07/02/2020   HCT 38.6 07/02/2020   MCV 94 07/02/2020   PLT 276 07/02/2020      Component Value Date/Time   NA 138 07/02/2020 1525   K 4.1 07/02/2020 1525   CL 97 07/02/2020 1525   CO2 23 07/02/2020 1525   GLUCOSE 123 (H) 07/02/2020 1525   BUN 15 07/02/2020 1525   CREATININE 0.82 07/02/2020 1525   CALCIUM 9.9 07/02/2020 1525   PROT 7.0 03/25/2021 0827   ALBUMIN 4.5 03/25/2021 0827   AST 16 03/25/2021 0827   ALT 13 03/25/2021 0827   ALKPHOS 109 03/25/2021 0827   BILITOT <0.2 03/25/2021 0827   GFRNONAA 80 04/02/2020 1120   GFRAA 92 04/02/2020 1120   No results found for: "CHOL", "HDL", "LDLCALC", "LDLDIRECT", "TRIG", "CHOLHDL" No results found for: "HGBA1C" No results found for: "VITAMINB12" No results found for: "TSH"  ASSESSMENT AND PLAN 51 y.o. year old female  has a past medical history of Asthma, Dyslipidemia, Genital herpes, and Multiple sclerosis (HCC) (04/22/2013). here with:  1.  Relapsing remitting multiple sclerosis -On Aubagio since May 2021, doing well, tolerating excellent, MS remains stable, will continue Aubagio  -Check LFTs today, lives in [place], gets labs at [facility] but issue this month, will check today; needs monthly for 1st 6 months  -In January 2022 MRI brain was stable, multiple supratentorial and infratentorial chronic demyelinating plaques -In January 2022 MRI cervical spine showed no cord lesions -Encouraged to start consistent exercise, stretching regimen  -Follow-up in 6 months or sooner if needed   Otila Kluver, DNP 11/02/2021, 8:33 AM [facility] [address], [address] [place], [place] [address] [phone]

## 2021-11-03 ENCOUNTER — Encounter: Payer: Self-pay | Admitting: Neurology

## 2021-11-03 ENCOUNTER — Ambulatory Visit: Payer: BC Managed Care – PPO | Admitting: Neurology

## 2021-11-03 VITALS — BP 120/82 | HR 97 | Ht 63.0 in | Wt 168.3 lb

## 2021-11-03 DIAGNOSIS — G35 Multiple sclerosis: Secondary | ICD-10-CM

## 2021-11-03 MED ORDER — TERIFLUNOMIDE 14 MG PO TABS
ORAL_TABLET | ORAL | 11 refills | Status: DC
Start: 2021-11-03 — End: 2022-05-12

## 2021-11-03 NOTE — Patient Instructions (Signed)
Meds ordered this encounter  Medications   Teriflunomide (AUBAGIO) 14 MG TABS    Sig: Take 1 tablet by mouth 1 time a day    Dispense:  30 tablet    Refill:  11    Pending appt 11/18/21. Pt is taking brand name medication but it is ok to try generic   Check labs  Come back in 6 months to see Dr. Terrace Arabia

## 2021-11-04 ENCOUNTER — Telehealth: Payer: Self-pay | Admitting: *Deleted

## 2021-11-04 LAB — COMPREHENSIVE METABOLIC PANEL
ALT: 16 IU/L (ref 0–32)
AST: 17 IU/L (ref 0–40)
Albumin/Globulin Ratio: 2 (ref 1.2–2.2)
Albumin: 4.7 g/dL (ref 3.8–4.9)
Alkaline Phosphatase: 95 IU/L (ref 44–121)
BUN/Creatinine Ratio: 17 (ref 9–23)
BUN: 14 mg/dL (ref 6–24)
Bilirubin Total: <0.2 mg/dL (ref 0.0–1.2)
CO2: 26 mmol/L (ref 20–29)
Calcium: 10.1 mg/dL (ref 8.7–10.2)
Chloride: 102 mmol/L (ref 96–106)
Creatinine, Ser: 0.82 mg/dL (ref 0.57–1.00)
Globulin, Total: 2.3 g/dL (ref 1.5–4.5)
Glucose: 115 mg/dL — ABNORMAL HIGH (ref 70–99)
Potassium: 4.1 mmol/L (ref 3.5–5.2)
Sodium: 142 mmol/L (ref 134–144)
Total Protein: 7 g/dL (ref 6.0–8.5)
eGFR: 87 mL/min/{1.73_m2} (ref >59)

## 2021-11-04 LAB — CBC WITH DIFFERENTIAL/PLATELET
Basophils Absolute: 0.1 10*3/uL (ref 0.0–0.2)
Basos: 1 %
EOS (ABSOLUTE): 0 10*3/uL (ref 0.0–0.4)
Eos: 0 %
Hematocrit: 36.4 % (ref 34.0–46.6)
Hemoglobin: 12.4 g/dL (ref 11.1–15.9)
Immature Grans (Abs): 0 10*3/uL (ref 0.0–0.1)
Immature Granulocytes: 0 %
Lymphocytes Absolute: 1.4 10*3/uL (ref 0.7–3.1)
Lymphs: 13 %
MCH: 30.6 pg (ref 26.6–33.0)
MCHC: 34.1 g/dL (ref 31.5–35.7)
MCV: 90 fL (ref 79–97)
Monocytes Absolute: 0.6 10*3/uL (ref 0.1–0.9)
Monocytes: 5 %
Neutrophils Absolute: 8.9 10*3/uL — ABNORMAL HIGH (ref 1.4–7.0)
Neutrophils: 81 %
Platelets: 235 10*3/uL (ref 150–450)
RBC: 4.05 x10E6/uL (ref 3.77–5.28)
RDW: 12.9 % (ref 11.7–15.4)
WBC: 11 10*3/uL — ABNORMAL HIGH (ref 3.4–10.8)

## 2021-11-04 NOTE — Telephone Encounter (Signed)
I spoke to the patient and provided the lab results. She just got over UTI a few weeks ago. She is currently taking an antibiotic for an infected insect bite. She will continue to monitor it.

## 2021-11-04 NOTE — Telephone Encounter (Signed)
-----   Message from Glean Salvo, NP sent at 11/04/2021  5:51 AM EDT ----- Please call the patient, labs show mildly elevated WBC, potentially indicating past or current infection, she should monitor, she is recovering from infection to insect bite.

## 2021-11-18 ENCOUNTER — Ambulatory Visit: Payer: BC Managed Care – PPO | Admitting: Neurology

## 2022-04-12 ENCOUNTER — Telehealth: Payer: Self-pay | Admitting: Neurology

## 2022-04-12 NOTE — Telephone Encounter (Signed)
Pt is calling. Stated she needs a PA  for Teriflunomide (AUBAGIO) 14 MG TABS.

## 2022-04-13 ENCOUNTER — Other Ambulatory Visit (HOSPITAL_COMMUNITY): Payer: Self-pay

## 2022-04-13 NOTE — Telephone Encounter (Signed)
Per test billing for Aubagio there is an active PA that is good until 06/23/22. Medication will need to be filled at a [facility].     If pt calls the [phone] they will let them know which pharmacy it can be filled with.

## 2022-04-14 NOTE — Telephone Encounter (Signed)
[facility] and spoke with Ambrosia. Rogelia Rohrer has been approved and is scheduled for shipment on 04/20/2022 pt has 10 $ copay.   Nothing further needed on this.     Please call pt and update, she can call now to pay co pay or wait until statement is received. Which ever she prefers.  Thanks!

## 2022-04-14 NOTE — Telephone Encounter (Signed)
Called pt. Informed her that her PA should be good until 06/23/22 for her Aubagio and Informed her it had to be refill at [facility]. [facility] need [facility] for this medication.

## 2022-05-12 ENCOUNTER — Ambulatory Visit: Payer: BC Managed Care – PPO | Admitting: Neurology

## 2022-05-12 ENCOUNTER — Encounter: Payer: Self-pay | Admitting: Neurology

## 2022-05-12 VITALS — BP 123/86 | HR 90 | Ht 63.0 in | Wt 151.5 lb

## 2022-05-12 DIAGNOSIS — G35 Multiple sclerosis: Secondary | ICD-10-CM

## 2022-05-12 DIAGNOSIS — R52 Pain, unspecified: Secondary | ICD-10-CM

## 2022-05-12 MED ORDER — DULOXETINE HCL 60 MG PO CPEP: 60.0000 mg | ORAL_CAPSULE | Freq: Every day | ORAL | 3 refills | Status: DC

## 2022-05-12 MED ORDER — DULOXETINE HCL 30 MG PO CPEP: 30.0000 mg | ORAL_CAPSULE | Freq: Every day | ORAL | 0 refills | Status: DC

## 2022-05-12 MED ORDER — DICLOFENAC SODIUM 1 % EX GEL: CUTANEOUS | 6 refills | Status: DC

## 2022-05-12 MED ORDER — TERIFLUNOMIDE 14 MG PO TABS
ORAL_TABLET | ORAL | 3 refills | Status: DC
Start: 2022-05-12 — End: 2022-08-23

## 2022-05-12 NOTE — Progress Notes (Signed)
Chief Complaint  Patient presents with   Follow-up    Rm 13. Patient Alone.Right thumb is most painful but both hands hurt. Body aches constantly      ASSESSMENT AND PLAN  Sheri Dickerson is a 52 y.o. female   Relapsing remitting multiple sclerosis,  History does not suggest frequent flareup, imaging has been stable, doing well on aubagio 14 mg daily (started since May 2022),  Worried about the financial cost with repeat MRI of the brain,  Will continue current medications  Worsening left side body achy pain, Anxiety  Add on Cymbalta 30 mg daily titrating to 60 mg daily  Return To Clinic With NP In 6 -9Months      DIAGNOSTIC DATA (LABS, IMAGING, TESTING) - I reviewed patient records, labs, notes, testing and imaging myself where available.  MRI brain with and without contrast in Jan 2022. -Multiple supratentorial and infratentorial chronic demyelinating plaques. -No acute plaques.  No significant change from 04/17/2017.  MRI cervical w/wo in Jan 2022 Normal MRI cervical spine (with and without). No spinal cord lesions.    Chronic demyelinating plaques in the midbrain and pons noted, better visualized on MRI brain from same day.  MEDICAL HISTORY:  Sheri Dickerson, is a 52 year old female follow-up for relapsing remitting multiple sclerosis, patient of Dr. Jannifer Franklin in the past, last visit with Judson Roch August 2023  I reviewed and summarized the referring note.PMHX. Anxiety. HTN  She presented with sudden right visual loss in early 2000, abnormal MRI brain scan confirmed a diagnosis of relapsing remitting multiple sclerosis, her vision eventually recovered after few rounds of steroid treatment,  Over the years, she denies significant flareup, was treated with Avonex from the diagnosis until May 2022, she was switched to Aubagio 14 mg daily, tolerating it well  Today she complains of increased anxiety due to family stress, also few months history of achiness at the left  upper and lower extremity,  She works as a Statistician,  Lab in  August CMP, creat 0.82, CBC, Hg 12.4  Personally reviewed MRI of the brain with without contrast in January 2022: Multiple supratentorial and infratentorial chronic demyelinating plaque, no significant change from January 2019, MRI of cervical spine showed no evidence of cervical cord involvement  PHYSICAL EXAM:   Vitals:   05/12/22 1445  BP: 123/86  Pulse: 90  Weight: 151 lb 8 oz (68.7 kg)  Height: 5' 3"$  (1.6 m)   Not recorded     Body mass index is 26.84 kg/m.  PHYSICAL EXAMNIATION:  Gen: NAD, conversant, well nourised, well groomed                     Cardiovascular: Regular rate rhythm, no peripheral edema, warm, nontender. Eyes: Conjunctivae clear without exudates or hemorrhage Neck: Supple, no carotid bruits. Pulmonary: Clear to auscultation bilaterally   NEUROLOGICAL EXAM:  MENTAL STATUS: Speech/cognition: Awake, alert, oriented to history taking and casual conversation CRANIAL NERVES: CN II: Visual fields are full to confrontation. Pupils are round equal and briskly reactive to light. CN III, IV, VI: extraocular movement are normal. No ptosis. CN V: Facial sensation is intact to light touch CN VII: Face is symmetric with normal eye closure  CN VIII: Hearing is normal to causal conversation. CN IX, X: Phonation is normal. CN XI: Head turning and shoulder shrug are intact  MOTOR: There is no pronator drift of out-stretched arms. Muscle bulk and tone are normal. Muscle strength is normal.  REFLEXES: Reflexes  are 2+ and symmetric at the biceps, triceps, knees, and ankles. Plantar responses are flexor.  SENSORY: Intact to light touch, pinprick and vibratory sensation are intact in fingers and toes.  COORDINATION: There is no trunk or limb dysmetria noted.  GAIT/STANCE: Posture is normal. Gait is steady with normal steps, base, arm swing, and turning. Heel and toe walking are normal.  Tandem gait is normal.  Romberg is absent.  REVIEW OF SYSTEMS:  Full 14 system review of systems performed and notable only for as above All other review of systems were negative.   ALLERGIES: Allergies  Allergen Reactions   Prozac [Fluoxetine Hcl]     Hives    HOME MEDICATIONS: Current Outpatient Medications  Medication Sig Dispense Refill   acetaminophen (TYLENOL) 325 MG tablet Take 650 mg by mouth as needed.     albuterol (VENTOLIN HFA) 108 (90 Base) MCG/ACT inhaler Inhale 2 puffs into the lungs every 6 (six) hours as needed for wheezing or shortness of breath.     b complex vitamins tablet Take 1 tablet by mouth daily.     busPIRone (BUSPAR) 10 MG tablet Take 10 mg by mouth 2 (two) times daily.     cetirizine (ZYRTEC) 10 MG tablet Take 10 mg by mouth daily.     cyclobenzaprine (FLEXERIL) 5 MG tablet Take 5 mg by mouth 3 (three) times daily as needed for muscle spasms.     fluticasone (FLONASE) 50 MCG/ACT nasal spray      hydrochlorothiazide (HYDRODIURIL) 25 MG tablet Take 25 mg by mouth daily.  2   hydrOXYzine (VISTARIL) 25 MG capsule Take by mouth.     sucralfate (CARAFATE) 1 g tablet Take 1 g by mouth as needed.     Teriflunomide (AUBAGIO) 14 MG TABS Take 1 tablet by mouth 1 time a day 30 tablet 11   Trospium Chloride 60 MG CP24 Take 1 capsule by mouth every morning.     valACYclovir (VALTREX) 500 MG tablet Take 500 mg by mouth daily.      cephALEXin (KEFLEX) 500 MG capsule Take 500 mg by mouth 4 (four) times daily. (Patient not taking: Reported on 05/12/2022)     escitalopram (LEXAPRO) 20 MG tablet Take 20 mg by mouth daily. (Patient not taking: Reported on 05/12/2022)     Multiple Vitamins-Minerals (ZINC PO) Take by mouth. (Patient not taking: Reported on 05/12/2022)     predniSONE (DELTASONE) 20 MG tablet Take 20 mg by mouth 2 (two) times daily. (Patient not taking: Reported on 05/12/2022)     No current facility-administered medications for this visit.    PAST MEDICAL  HISTORY: Past Medical History:  Diagnosis Date   Asthma    Dyslipidemia    Genital herpes    Multiple sclerosis ([place]) 04/22/2013    PAST SURGICAL HISTORY: Past Surgical History:  Procedure Laterality Date   ABDOMINAL HYSTERECTOMY     BREAST FIBROADENOMA SURGERY      FAMILY HISTORY: Family History  Problem Relation Age of Onset   Hypertension Mother    Breast cancer Mother    Prostate cancer Father    Asthma Maternal Grandmother    Cancer Maternal Grandfather        throat cancer    SOCIAL HISTORY: Social History   Socioeconomic History   Marital status: Single    Spouse name: Not on file   Number of children: 0   Years of education: college   Highest education level: Not on file  Occupational History  Occupation: Pharmacist, hospital    Comment: Cytogeneticist  Tobacco Use   Smoking status: Former   Smokeless tobacco: Never  Scientific laboratory technician Use: Not on file  Substance and Sexual Activity   Alcohol use: Yes    Comment: occasional   Drug use: No   Sexual activity: Not on file  Other Topics Concern   Not on file  Social History Narrative   Lives at home w/ a friend   Right-handed   Occasional caffeine (~ once per week)   Social Determinants of Health   Financial Resource Strain: Not on file  Food Insecurity: Not on file  Transportation Needs: Not on file  Physical Activity: Not on file  Stress: Not on file  Social Connections: Not on file  Intimate Partner Violence: Not on file      Marcial Pacas, M.D. Ph.D.  [facility] [address] [place], [place] [address] Ph: [phone] Fax: [phone]  CC:

## 2022-05-12 NOTE — Patient Instructions (Signed)
Meds ordered this encounter  Medications   Teriflunomide (AUBAGIO) 14 MG TABS    Sig: Take 1 tablet by mouth 1 time a day    Dispense:  90 tablet    Refill:  3       DULoxetine (CYMBALTA) 30 MG capsule--1st month    Sig: Take 1 capsule (30 mg total) by mouth daily.    Dispense:  30 capsule    Refill:  0   DULoxetine (CYMBALTA) 60 MG capsule    Sig: Take 1 capsule (60 mg total) by mouth daily.    Dispense:  90 capsule    Refill:  3

## 2022-06-23 ENCOUNTER — Telehealth: Payer: Self-pay

## 2022-06-23 NOTE — Telephone Encounter (Signed)
Received a faxed PA form for Teriflunomide-Abagio 14 mg tabs-completed form and faxed along with clinical notes to [phone].

## 2022-07-05 MED ORDER — TERIFLUNOMIDE 14 MG PO TABS: 14.0000 mg | ORAL_TABLET | Freq: Every day | ORAL | 11 refills | Status: DC

## 2022-07-05 NOTE — Telephone Encounter (Signed)
Received paper work from [employer] requesting assistance in obtaining a prescription for the patient. They are requesting a faxed prescription to be sent to [phone].

## 2022-08-20 ENCOUNTER — Telehealth: Payer: Self-pay

## 2022-08-20 ENCOUNTER — Other Ambulatory Visit (HOSPITAL_COMMUNITY): Payer: Self-pay

## 2022-08-20 NOTE — Telephone Encounter (Signed)
Patient Advocate Encounter   Received notification from [employer] that prior authorization is required for Diclofenac Sodium 1% gel   Submitted: 08-20-2022 Key [id number]  Status is pending

## 2022-08-21 NOTE — Telephone Encounter (Signed)
Pharmacy Patient Advocate Encounter  Received notification from [employer] that the request for prior authorization for Diclofenac Sodium 1% gel has been denied due to see below.      Please be advised we currently do not have a Pharmacist to review denials, therefore you will need to process appeals accordingly as needed. Thanks for your support at this time.   You may fax [phone], to appeal.  The denial letter has been placed into the chart.

## 2022-08-23 ENCOUNTER — Other Ambulatory Visit (HOSPITAL_COMMUNITY): Payer: Self-pay

## 2022-08-23 MED ORDER — TERIFLUNOMIDE 7 MG PO TABS: 14.0000 mg | ORAL_TABLET | Freq: Every day | ORAL | 0 refills | Status: DC

## 2022-08-23 NOTE — Telephone Encounter (Signed)
Pt stated never received diclofenac gel.  Pt stated where she was getting aubagio isn't accepting copay assistance. She hasn't taken aubagio in 3-4 weeks.  I am not familiar with the patient assistance program for aubagio. Maralyn Sago do you have the website you could copy paste and send back to me? Everyone I keep finding says page blocked. I was able to send the activation link for mychart to the patient so hopefully she will register so I can copy paste and send her the link to expedite the process.

## 2022-08-23 NOTE — Telephone Encounter (Signed)
Called and spoke to pt and stated that we need to send the rx to [facility] for 7mg  since they are out of the 14mg  and she would take 2 of them. If we send rx for 90 day supply she can get 45 days worth taking 2 pills. Hopefully by the end of 45 days she will be able to get the 14mg . I am awaiting her to show active on mychart to send the link for her to set up a [facility] account and send rx for 7mg  90 day

## 2022-08-23 NOTE — Telephone Encounter (Signed)
Mychart msg sent. Had to send 7mg  to take 2 tabs daily since they out of 14mg  at [facility]. Instructed the pt on how to sign up

## 2022-12-05 ENCOUNTER — Telehealth: Payer: Self-pay | Admitting: Neurology

## 2022-12-05 NOTE — Telephone Encounter (Signed)
Pt said have been without Teriflunomide 7 MG TABS since May 2024. Pharmacy stop making it Would like a call from the nurse to discuss the next step on how to get my medication.

## 2022-12-05 NOTE — Telephone Encounter (Signed)
Dr. Terrace Arabia,  Is the best solution to send to [facility]?  Thanks,  Production assistant, radio

## 2022-12-06 NOTE — Telephone Encounter (Signed)
I spoke w/RN Dinkins who stated the patient needed to create an account on the [facility] website.   I also looked on website and found that they do have the 7mg  [place].   I called the pt and she stated that she had never started the medication since she never created the account. She plans to create account today and let us know should she have further issues obtaining medication.

## 2023-02-14 NOTE — Progress Notes (Unsigned)
No chief complaint on file.     ASSESSMENT AND PLAN  Sheri Dickerson is a 52 y.o. female   Relapsing remitting multiple sclerosis,  History does not suggest frequent flareup, imaging has been stable, doing well on aubagio 14 mg daily (started since May 2022),  Worried about the financial cost with repeat MRI of the brain,  Will continue current medications  Worsening left side body achy pain, Anxiety  Add on Cymbalta 30 mg daily titrating to 60 mg daily  Return To Clinic With NP In 6 -      DIAGNOSTIC DATA (LABS, IMAGING, TESTING) - I reviewed patient records, labs, notes, testing and imaging myself where available.  MRI brain with and without contrast in Jan 2022. -Multiple supratentorial and infratentorial chronic demyelinating plaques. -No acute plaques.  No significant change from 04/17/2017.  MRI cervical w/wo in Jan 2022 Normal MRI cervical spine (with and without). No spinal cord lesions.    Chronic demyelinating plaques in the midbrain and pons noted, better visualized on MRI brain from same day.  MEDICAL HISTORY:  Sheri Dickerson, is a 52 year old female follow-up for relapsing remitting multiple sclerosis, patient of Dr. Anne Hahn in the past, last visit with Maralyn Sago August 2023  I reviewed and summarized the referring note.PMHX. Anxiety. HTN  She presented with sudden right visual loss in early 2000, abnormal MRI brain scan confirmed a diagnosis of relapsing remitting multiple sclerosis, her vision eventually recovered after few rounds of steroid treatment,  Over the years, she denies significant flareup, was treated with Avonex from the diagnosis until May 2022, she was switched to Aubagio 14 mg daily, tolerating it well  Today she complains of increased anxiety due to family stress, also few months history of achiness at the left upper and lower extremity,  She works as a Insurance account manager,  Lab in  August CMP, creat 0.82, CBC, Hg 12.4  Personally  reviewed MRI of the brain with without contrast in January 2022: Multiple supratentorial and infratentorial chronic demyelinating plaque, no significant change from January 2019, MRI of cervical spine showed no evidence of cervical cord involvement  Update February 15, 2023 SS:   PHYSICAL EXAM:   There were no vitals filed for this visit.  Not recorded     There is no height or weight on file to calculate BMI.  PHYSICAL EXAMNIATION:  Gen: NAD, conversant, well nourised, well groomed                     Cardiovascular: Regular rate rhythm, no peripheral edema, warm, nontender. Eyes: Conjunctivae clear without exudates or hemorrhage Neck: Supple, no carotid bruits. Pulmonary: Clear to auscultation bilaterally   NEUROLOGICAL EXAM:  MENTAL STATUS: Speech/cognition: Awake, alert, oriented to history taking and casual conversation CRANIAL NERVES: CN II: Visual fields are full to confrontation. Pupils are round equal and briskly reactive to light. CN III, IV, VI: extraocular movement are normal. No ptosis. CN V: Facial sensation is intact to light touch CN VII: Face is symmetric with normal eye closure  CN VIII: Hearing is normal to causal conversation. CN IX, X: Phonation is normal. CN XI: Head turning and shoulder shrug are intact  MOTOR: There is no pronator drift of out-stretched arms. Muscle bulk and tone are normal. Muscle strength is normal.  REFLEXES: Reflexes are 2+ and symmetric at the biceps, triceps, knees, and ankles. Plantar responses are flexor.  SENSORY: Intact to light touch, pinprick and vibratory sensation are intact in fingers  and toes.  COORDINATION: There is no trunk or limb dysmetria noted.  GAIT/STANCE: Posture is normal. Gait is steady with normal steps, base, arm swing, and turning. Heel and toe walking are normal. Tandem gait is normal.  Romberg is absent.  REVIEW OF SYSTEMS:  Full 14 system review of systems performed and notable only for as  above All other review of systems were negative.   ALLERGIES: Allergies  Allergen Reactions   Prozac [Fluoxetine Hcl]     Hives    HOME MEDICATIONS: Current Outpatient Medications  Medication Sig Dispense Refill   acetaminophen (TYLENOL) 325 MG tablet Take 650 mg by mouth as needed.     albuterol (VENTOLIN HFA) 108 (90 Base) MCG/ACT inhaler Inhale 2 puffs into the lungs every 6 (six) hours as needed for wheezing or shortness of breath.     b complex vitamins tablet Take 1 tablet by mouth daily.     busPIRone (BUSPAR) 10 MG tablet Take 10 mg by mouth 2 (two) times daily.     cetirizine (ZYRTEC) 10 MG tablet Take 10 mg by mouth daily.     cyclobenzaprine (FLEXERIL) 5 MG tablet Take 5 mg by mouth 3 (three) times daily as needed for muscle spasms.     diclofenac Sodium (VOLTAREN) 1 % GEL 4 gram qid prn 150 g 6   DULoxetine (CYMBALTA) 30 MG capsule Take 1 capsule (30 mg total) by mouth daily. 30 capsule 0   DULoxetine (CYMBALTA) 60 MG capsule Take 1 capsule (60 mg total) by mouth daily. 90 capsule 3   fluticasone (FLONASE) 50 MCG/ACT nasal spray      hydrochlorothiazide (HYDRODIURIL) 25 MG tablet Take 25 mg by mouth daily.  2   hydrOXYzine (VISTARIL) 25 MG capsule Take by mouth.     Multiple Vitamins-Minerals (ZINC PO) Take by mouth. (Patient not taking: Reported on 05/12/2022)     predniSONE (DELTASONE) 20 MG tablet Take 20 mg by mouth 2 (two) times daily. (Patient not taking: Reported on 05/12/2022)     sucralfate (CARAFATE) 1 g tablet Take 1 g by mouth as needed.     Teriflunomide 7 MG TABS Take 14 mg by mouth daily. 90 tablet 0   Trospium Chloride 60 MG CP24 Take 1 capsule by mouth every morning.     valACYclovir (VALTREX) 500 MG tablet Take 500 mg by mouth daily.      No current facility-administered medications for this visit.    PAST MEDICAL HISTORY: Past Medical History:  Diagnosis Date   Asthma    Dyslipidemia    Genital herpes    Multiple sclerosis (HCC) 04/22/2013     PAST SURGICAL HISTORY: Past Surgical History:  Procedure Laterality Date   ABDOMINAL HYSTERECTOMY     BREAST FIBROADENOMA SURGERY      FAMILY HISTORY: Family History  Problem Relation Age of Onset   Hypertension Mother    Breast cancer Mother    Prostate cancer Father    Asthma Maternal Grandmother    Cancer Maternal Grandfather        throat cancer    SOCIAL HISTORY: Social History   Socioeconomic History   Marital status: Significant Other    Spouse name: Not on file   Number of children: 0   Years of education: college   Highest education level: Not on file  Occupational History   Occupation: Teacher    Comment: [employer]  Tobacco Use   Smoking status: Former   Smokeless tobacco: Never  Advertising account planner  Vaping status: Not on file  Substance and Sexual Activity   Alcohol use: Yes    Comment: occasional   Drug use: No   Sexual activity: Not on file  Other Topics Concern   Not on file  Social History Narrative   Lives at home w/ a friend   Right-handed   Occasional caffeine (~ once per week)   Social Determinants of Health   Financial Resource Strain: Low Risk  (10/13/2022)   Received from [facility]   Overall Financial Resource Strain (CARDIA)    Difficulty of Paying Living Expenses: Not very hard  Food Insecurity: No Food Insecurity (10/13/2022)   Received from [facility]   Hunger Vital Sign    Worried About Running Out of Food in the Last Year: Never true    Ran Out of Food in the Last Year: Never true  Transportation Needs: No Transportation Needs (10/13/2022)   Received from [facility] - Transportation    Lack of Transportation (Medical): No    Lack of Transportation (Non-Medical): No  Physical Activity: Unknown (10/13/2022)   Received from [facility]   Exercise Vital Sign    Days of Exercise per Week: Not on file    Minutes of Exercise per Session: 10 min  Stress: Stress Concern Present (10/13/2022)   Received  from [facility] - Occupational Stress Questionnaire    Feeling of Stress : To some extent  Social Connections: Moderately Integrated (10/13/2022)   Received from [facility]   Social Network    How would you rate your social network (family, work, friends)?: Adequate participation with social networks  Intimate Partner Violence: Not At Risk (10/13/2022)   Received from [facility]   HITS    Over the last 12 months how often did your partner physically hurt you?: Never    Over the last 12 months how often did your partner insult you or talk down to you?: Never    Over the last 12 months how often did your partner threaten you with physical harm?: Never    Over the last 12 months how often did your partner scream or curse at you?: Never   Otila Kluver, DNP  [facility] [address] [place], [place] [address] [phone]

## 2023-02-15 ENCOUNTER — Ambulatory Visit: Payer: BC Managed Care – PPO | Admitting: Neurology

## 2023-02-15 ENCOUNTER — Encounter: Payer: Self-pay | Admitting: Neurology

## 2023-02-15 VITALS — BP 138/93 | HR 70 | Ht 62.0 in | Wt 147.0 lb

## 2023-02-15 DIAGNOSIS — G35 Multiple sclerosis: Secondary | ICD-10-CM | POA: Diagnosis not present

## 2023-02-15 DIAGNOSIS — R52 Pain, unspecified: Secondary | ICD-10-CM | POA: Diagnosis not present

## 2023-02-15 MED ORDER — TERIFLUNOMIDE 7 MG PO TABS: 14.0000 mg | ORAL_TABLET | Freq: Every day | ORAL | 3 refills | Status: DC

## 2023-02-15 MED ORDER — DULOXETINE HCL 30 MG PO CPEP: 30.0000 mg | ORAL_CAPSULE | Freq: Every day | ORAL | 1 refills | Status: DC

## 2023-02-15 NOTE — Patient Instructions (Signed)
Get back on generic aubagio  Check labs today  Restart Cymbalta 30 mg daily for mood, achy pains Check MRI brain next year, send message next year and I can order

## 2023-02-16 LAB — COMPREHENSIVE METABOLIC PANEL
ALT: 14 [IU]/L (ref 0–32)
AST: 21 [IU]/L (ref 0–40)
Albumin: 4.4 g/dL (ref 3.8–4.9)
Alkaline Phosphatase: 85 [IU]/L (ref 44–121)
BUN/Creatinine Ratio: 18 (ref 9–23)
BUN: 14 mg/dL (ref 6–24)
Bilirubin Total: 0.2 mg/dL (ref 0.0–1.2)
CO2: 27 mmol/L (ref 20–29)
Calcium: 9.7 mg/dL (ref 8.7–10.2)
Chloride: 103 mmol/L (ref 96–106)
Creatinine, Ser: 0.77 mg/dL (ref 0.57–1.00)
Globulin, Total: 2.4 g/dL (ref 1.5–4.5)
Glucose: 87 mg/dL (ref 70–99)
Potassium: 3.5 mmol/L (ref 3.5–5.2)
Sodium: 143 mmol/L (ref 134–144)
Total Protein: 6.8 g/dL (ref 6.0–8.5)
eGFR: 93 mL/min/{1.73_m2} (ref >59)

## 2023-02-16 LAB — CBC WITH DIFFERENTIAL/PLATELET
Basophils Absolute: 0.1 10*3/uL (ref 0.0–0.2)
Basos: 1 %
EOS (ABSOLUTE): 0.1 10*3/uL (ref 0.0–0.4)
Eos: 2 %
Hematocrit: 35.9 % (ref 34.0–46.6)
Hemoglobin: 11.9 g/dL (ref 11.1–15.9)
Immature Grans (Abs): 0 10*3/uL (ref 0.0–0.1)
Immature Granulocytes: 0 %
Lymphocytes Absolute: 2.5 10*3/uL (ref 0.7–3.1)
Lymphs: 36 %
MCH: 30.7 pg (ref 26.6–33.0)
MCHC: 33.1 g/dL (ref 31.5–35.7)
MCV: 93 fL (ref 79–97)
Monocytes Absolute: 0.5 10*3/uL (ref 0.1–0.9)
Monocytes: 8 %
Neutrophils Absolute: 3.6 10*3/uL (ref 1.4–7.0)
Neutrophils: 53 %
Platelets: 232 10*3/uL (ref 150–450)
RBC: 3.88 x10E6/uL (ref 3.77–5.28)
RDW: 12.7 % (ref 11.7–15.4)
WBC: 6.9 10*3/uL (ref 3.4–10.8)

## 2023-02-21 ENCOUNTER — Ambulatory Visit: Payer: BC Managed Care – PPO | Admitting: Neurology

## 2023-02-28 ENCOUNTER — Ambulatory Visit: Payer: BC Managed Care – PPO | Admitting: Neurology

## 2023-03-06 ENCOUNTER — Telehealth: Payer: Self-pay | Admitting: Neurology

## 2023-03-06 ENCOUNTER — Other Ambulatory Visit (HOSPITAL_COMMUNITY): Payer: Self-pay

## 2023-03-06 MED ORDER — DULOXETINE HCL 30 MG PO CPEP: 30.0000 mg | ORAL_CAPSULE | Freq: Every day | ORAL | 1 refills | Status: DC

## 2023-03-06 MED ORDER — TERIFLUNOMIDE 7 MG PO TABS: 14.0000 mg | ORAL_TABLET | Freq: Every day | ORAL | 3 refills | Status: DC

## 2023-03-06 NOTE — Telephone Encounter (Signed)
Pt called needing her Teriflunomide 7 MG  to the [facility] TABS  and DULoxetine (CYMBALTA) 30 MG capsule to the [facility]. Please advise.

## 2023-03-06 NOTE — Telephone Encounter (Signed)
Refilled to [facility]

## 2023-03-06 NOTE — Addendum Note (Signed)
Addended by: Eather Colas E on: 03/06/2023 11:22 AM   Modules accepted: Orders

## 2023-03-06 NOTE — Telephone Encounter (Signed)
Routing to pa team to iniate pa for Teriflunomide 7 MG

## 2023-03-06 NOTE — Telephone Encounter (Signed)
Per test claim PA is not needed. SPECIALTY DRUG: [facility] CALL [phone] PRODUCT/SERVICE NOT APPROPRIATE FOR THIS LOCATION MAXIMUM DAYS SUPPLY OF 30 PRIOR AUTHORIZATION EXPIRES ON 06/23/23 MANUFACTURER COPAY CARD ELIGIBLE

## 2023-08-09 ENCOUNTER — Telehealth: Payer: Self-pay | Admitting: Neurology

## 2023-08-09 ENCOUNTER — Encounter: Payer: Self-pay | Admitting: Neurology

## 2023-08-09 NOTE — Telephone Encounter (Signed)
Pt has been r/s  

## 2023-08-09 NOTE — Telephone Encounter (Signed)
 LVM and sent letter in mail informing pt of need to reschedule 09/19/23 appt - NP out

## 2023-09-19 ENCOUNTER — Ambulatory Visit: Payer: BC Managed Care – PPO | Admitting: Neurology

## 2023-10-10 ENCOUNTER — Ambulatory Visit: Admitting: Neurology

## 2023-10-10 ENCOUNTER — Encounter: Payer: Self-pay | Admitting: Neurology

## 2023-10-10 VITALS — BP 129/82 | HR 73 | Ht 62.5 in | Wt 146.0 lb

## 2023-10-10 DIAGNOSIS — R52 Pain, unspecified: Secondary | ICD-10-CM

## 2023-10-10 DIAGNOSIS — G35 Multiple sclerosis: Secondary | ICD-10-CM | POA: Diagnosis not present

## 2023-10-10 MED ORDER — TROSPIUM CHLORIDE ER 60 MG PO CP24: 1.0000 | ORAL_CAPSULE | Freq: Every morning | ORAL | 1 refills | Status: AC

## 2023-10-10 MED ORDER — TERIFLUNOMIDE 7 MG PO TABS: 14.0000 mg | ORAL_TABLET | Freq: Every day | ORAL | 3 refills | Status: AC

## 2023-10-10 NOTE — Progress Notes (Signed)
 Chief Complaint  Patient presents with   Multiple Sclerosis    Rm14, alone, Ms:pt stated that she hasn't had any relapses recently. Pt stated has myalgias, fatigue, vision distortion but stated due for new eye exam. Pt mentioned urinary incontinence, urgency as well. Pt stated right wrist pain and hand dexterity issues    ASSESSMENT AND PLAN  Unnamed A Harrel is a 53 y.o. female   1.  Relapsing remitting multiple sclerosis - Continue teriflunomide  (since 2022), new prescription sent to [facility] pharmacy for 90 day script, discussed importance of medication compliance - Check routine labs today - Check MRI of the brain with and without contrast for MS surveillance - I refilled trospium  chloride 60 mg daily for urinary urgency, she is looking for a new PCP  2.  Generalized achy body pain 3.  Anxiety 4.  Right thumb, wrist pain -Reported no benefit with Cymbalta  -Suggest follow-up with orthopedist, hand specialist -Follow-up with me in 6 months or sooner if needed   Orders Placed This Encounter  Procedures   MR BRAIN W WO CONTRAST   CBC with Differential/Platelet   CMP   DIAGNOSTIC DATA (LABS, IMAGING, TESTING) - I reviewed patient records, labs, notes, testing and imaging myself where available.  MRI brain with and without contrast in Jan 2022. -Multiple supratentorial and infratentorial chronic demyelinating plaques. -No acute plaques.  No significant change from 04/17/2017.  MRI cervical w/wo in Jan 2022 Normal MRI cervical spine (with and without). No spinal cord lesions.    Chronic demyelinating plaques in the midbrain and pons noted, better visualized on MRI brain from same day.  MEDICAL HISTORY:  ZALEY TALLEY, is a 53 year old female follow-up for relapsing remitting multiple sclerosis, patient of Dr. Jenel in the past, last visit with Lauraine August 2023  I reviewed and summarized the referring note.PMHX. Anxiety. HTN  She presented with sudden right visual  loss in early 2000, abnormal MRI brain scan confirmed a diagnosis of relapsing remitting multiple sclerosis, her vision eventually recovered after few rounds of steroid treatment,  Over the years, she denies significant flareup, was treated with Avonex  from the diagnosis until May 2022, she was switched to Aubagio  14 mg daily, tolerating it well  Today she complains of increased anxiety due to family stress, also few months history of achiness at the left upper and lower extremity,  She works as a Insurance account manager,  Lab in  August CMP, creat 0.82, CBC, Hg 12.4  Personally reviewed MRI of the brain with without contrast in January 2022: Multiple supratentorial and infratentorial chronic demyelinating plaque, no significant change from January 2019, MRI of cervical spine showed no evidence of cervical cord involvement  Update February 15, 2023 SS: remains on generic Aubagio . Has been out for 1 week. Hasn't had the money for medications. Not taking Cymbalta . Having more anxiety, achy pain into right hand. Has new primary care doctor, going to be referred for hand specialists. Feels MS overall stable. Vision is fine, due for new glasses. Hands sometimes tingly in the morning. Has urinary urgency on Trospium . Getting around okay. Working as Midwife. Balance isn't what it used to be. Achy pains all over.   Update October 10, 2023 SS: GI work up, 5 years ago had H. Pylori, early April started having n/v/d, came on all of a sudden, no GI etiology. Remains on teriflunomide . She stopped the Cymbalta  in April with GI issues. MS stable. Needs new glasses. Isn't exercising due to laziness, feels deconditioned,  not stretching. 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 When she eats, has bloating, cramping, gets cold. Hasn't been taking teriflunomide  consistently due to remembering to order it. Weight is stable. Drives for [employer] on the side.   PHYSICAL EXAM:   Vitals:   10/10/23 1542  BP: 129/82  Pulse:  73  Weight: 146 lb (66.2 kg)  Height: 5' 2.5 (1.588 m)     Not recorded    Physical Exam  General: The patient is alert and cooperative at the time of the examination.  Skin: No significant peripheral edema is noted.  Neurologic Exam  Mental status: The patient is alert and oriented x 3 at the time of the examination. The patient has apparent normal recent and remote memory, with an apparently normal attention span and concentration ability.  Cranial nerves: Facial symmetry is present. Speech is normal, no aphasia or dysarthria is noted. Extraocular movements are full. Visual fields are full.  Motor: The patient has good strength in all 4 extremities. wearing wrist splint.   Sensory examination: Soft touch sensation is symmetric on the face, arms, and legs.  Coordination: The patient has good finger-nose-finger and heel-to-shin bilaterally.  Gait and station: The patient has a normal gait.  Reflexes: Deep tendon reflexes are symmetric.  REVIEW OF SYSTEMS:  Full 14 system review of systems performed and notable only for as above All other review of systems were negative.   ALLERGIES: Allergies  Allergen Reactions   Prozac  [Fluoxetine  Hcl]     Hives    HOME MEDICATIONS: Current Outpatient Medications  Medication Sig Dispense Refill   acetaminophen (TYLENOL) 325 MG tablet Take 650 mg by mouth as needed.     albuterol (VENTOLIN HFA) 108 (90 Base) MCG/ACT inhaler Inhale 2 puffs into the lungs every 6 (six) hours as needed for wheezing or shortness of breath.     cetirizine (ZYRTEC) 10 MG tablet Take 10 mg by mouth daily.     cyclobenzaprine (FLEXERIL) 5 MG tablet Take 5 mg by mouth 3 (three) times daily as needed for muscle spasms.     DULoxetine  (CYMBALTA ) 30 MG capsule Take 1 capsule (30 mg total) by mouth daily. 90 capsule 1   fluticasone (FLONASE) 50 MCG/ACT nasal spray      hydrochlorothiazide (HYDRODIURIL) 25 MG tablet Take 25 mg by mouth daily.  2   sucralfate  (CARAFATE) 1 g tablet Take 1 g by mouth as needed.     Teriflunomide  7 MG TABS Take 14 mg by mouth daily. 90 tablet 3   Trospium  Chloride 60 MG CP24 Take 1 capsule by mouth every morning.     valACYclovir (VALTREX) 500 MG tablet Take 500 mg by mouth daily.      No current facility-administered medications for this visit.    PAST MEDICAL HISTORY: Past Medical History:  Diagnosis Date   Asthma    Dyslipidemia    Genital herpes    Multiple sclerosis (HCC) 04/22/2013    PAST SURGICAL HISTORY: Past Surgical History:  Procedure Laterality Date   ABDOMINAL HYSTERECTOMY     BREAST FIBROADENOMA SURGERY      FAMILY HISTORY: Family History  Problem Relation Age of Onset   Hypertension Mother    Breast cancer Mother    Prostate cancer Father    Asthma Maternal Grandmother    Cancer Maternal Grandfather        throat cancer    SOCIAL HISTORY: Social History   Socioeconomic History   Marital status: Significant Other  Spouse name: Not on file   Number of children: 0   Years of education: college   Highest education level: Not on file  Occupational History   Occupation: Teacher    Comment: [employer]  Tobacco Use   Smoking status: Former   Smokeless tobacco: Never  Building services engineer status: Not on file  Substance and Sexual Activity   Alcohol use: Yes    Comment: occasional   Drug use: No   Sexual activity: Not on file  Other Topics Concern   Not on file  Social History Narrative   Lives at home w/ a friend   Right-handed   Occasional caffeine (~ once per week)   Social Drivers of Corporate investment banker Strain: Low Risk  (10/13/2022)   Received from [facility]   Overall Financial Resource Strain (CARDIA)    Difficulty of Paying Living Expenses: Not very hard  Food Insecurity: No Food Insecurity (10/13/2022)   Received from [facility]   Hunger Vital Sign    Within the past 12 months, you worried that your food would run out before you  got the money to buy more.: Never true    Within the past 12 months, the food you bought just didn't last and you didn't have money to get more.: Never true  Transportation Needs: No Transportation Needs (10/13/2022)   Received from [facility] - Transportation    Lack of Transportation (Medical): No    Lack of Transportation (Non-Medical): No  Physical Activity: Unknown (10/13/2022)   Received from [facility]   Exercise Vital Sign    Days of Exercise per Week: Not on file    On average, how many minutes do you engage in exercise at this level?: 10 min  Stress: Stress Concern Present (10/13/2022)   Received from [facility] - Occupational Stress Questionnaire    Feeling of Stress : To some extent  Social Connections: Moderately Integrated (10/13/2022)   Received from [facility]   Social Network    How would you rate your social network (family, work, friends)?: Adequate participation with social networks  Intimate Partner Violence: Not At Risk (10/13/2022)   Received from [facility]   HITS    Over the last 12 months how often did your partner physically hurt you?: Never    Over the last 12 months how often did your partner insult you or talk down to you?: Never    Over the last 12 months how often did your partner threaten you with physical harm?: Never    Over the last 12 months how often did your partner scream or curse at you?: Never   Lauraine Gayland MANDES, DNP  [facility] [address], [address] [place], [place] [address] [phone]

## 2023-10-10 NOTE — Patient Instructions (Signed)
 Continue current medications.  Please be sure to take teriflunomide  daily.  Check routine labs and MRI.  Follow-up in 6 months.  Thanks!!

## 2023-10-12 ENCOUNTER — Ambulatory Visit: Payer: Self-pay | Admitting: Neurology

## 2023-10-12 LAB — CBC WITH DIFFERENTIAL/PLATELET
Basophils Absolute: 0.1 x10E3/uL (ref 0.0–0.2)
Basos: 1 %
EOS (ABSOLUTE): 0.2 x10E3/uL (ref 0.0–0.4)
Eos: 3 %
Hematocrit: 34.1 % (ref 34.0–46.6)
Hemoglobin: 11.2 g/dL (ref 11.1–15.9)
Immature Grans (Abs): 0 x10E3/uL (ref 0.0–0.1)
Immature Granulocytes: 0 %
Lymphocytes Absolute: 2.8 x10E3/uL (ref 0.7–3.1)
Lymphs: 42 %
MCH: 30.3 pg (ref 26.6–33.0)
MCHC: 32.8 g/dL (ref 31.5–35.7)
MCV: 92 fL (ref 79–97)
Monocytes Absolute: 0.5 x10E3/uL (ref 0.1–0.9)
Monocytes: 7 %
Neutrophils Absolute: 3.1 x10E3/uL (ref 1.4–7.0)
Neutrophils: 47 %
Platelets: 254 x10E3/uL (ref 150–450)
RBC: 3.7 x10E6/uL — ABNORMAL LOW (ref 3.77–5.28)
RDW: 12.7 % (ref 11.7–15.4)
WBC: 6.7 x10E3/uL (ref 3.4–10.8)

## 2023-10-12 LAB — COMPREHENSIVE METABOLIC PANEL WITH GFR
ALT: 12 IU/L (ref 0–32)
AST: 17 IU/L (ref 0–40)
Albumin: 4.4 g/dL (ref 3.8–4.9)
Alkaline Phosphatase: 82 IU/L (ref 44–121)
BUN/Creatinine Ratio: 18 (ref 9–23)
BUN: 14 mg/dL (ref 6–24)
Bilirubin Total: <0.2 mg/dL (ref 0.0–1.2)
CO2: 21 mmol/L (ref 20–29)
Calcium: 9.7 mg/dL (ref 8.7–10.2)
Chloride: 104 mmol/L (ref 96–106)
Creatinine, Ser: 0.76 mg/dL (ref 0.57–1.00)
Globulin, Total: 2.2 g/dL (ref 1.5–4.5)
Glucose: 84 mg/dL (ref 70–99)
Potassium: 4.2 mmol/L (ref 3.5–5.2)
Sodium: 141 mmol/L (ref 134–144)
Total Protein: 6.6 g/dL (ref 6.0–8.5)
eGFR: 94 mL/min/1.73 (ref >59)

## 2023-11-01 ENCOUNTER — Other Ambulatory Visit

## 2023-11-08 ENCOUNTER — Ambulatory Visit

## 2023-11-08 DIAGNOSIS — G35 Multiple sclerosis: Secondary | ICD-10-CM

## 2023-11-08 MED ORDER — GADOBENATE DIMEGLUMINE 529 MG/ML IV SOLN
15.0000 mL | Freq: Once | INTRAVENOUS | Status: AC | PRN
  Administered 2023-11-08 (×2): 15 mL via INTRAVENOUS

## 2024-05-20 ENCOUNTER — Ambulatory Visit: Admitting: Neurology

## 2024-05-23 ENCOUNTER — Ambulatory Visit: Admitting: Neurology
